# Patient Record
Sex: Male | Born: 1975 | Race: Asian | Hispanic: No | Marital: Single | Smoking: Never smoker
Health system: Southern US, Community
[De-identification: ages and names within clinical notes are randomized; demographics above are authoritative.]

## PROBLEM LIST (undated history)

## (undated) DIAGNOSIS — I2699 Other pulmonary embolism without acute cor pulmonale: Secondary | ICD-10-CM

## (undated) DIAGNOSIS — I82409 Acute embolism and thrombosis of unspecified deep veins of unspecified lower extremity: Secondary | ICD-10-CM

## (undated) HISTORY — DX: Acute embolism and thrombosis of unspecified deep veins of unspecified lower extremity: I82.409

---

## 2005-01-01 ENCOUNTER — Emergency Department (HOSPITAL_COMMUNITY): Admission: EM | Admit: 2005-01-01 | Discharge: 2005-01-01 | Payer: Self-pay | Admitting: Family Medicine

## 2005-01-16 DIAGNOSIS — I82409 Acute embolism and thrombosis of unspecified deep veins of unspecified lower extremity: Secondary | ICD-10-CM

## 2005-01-16 HISTORY — DX: Acute embolism and thrombosis of unspecified deep veins of unspecified lower extremity: I82.409

## 2005-06-22 ENCOUNTER — Emergency Department (HOSPITAL_COMMUNITY): Admission: EM | Admit: 2005-06-22 | Discharge: 2005-06-22 | Payer: Self-pay | Admitting: Emergency Medicine

## 2005-06-23 ENCOUNTER — Inpatient Hospital Stay (HOSPITAL_COMMUNITY): Admission: EM | Admit: 2005-06-23 | Discharge: 2005-06-24 | Payer: Self-pay | Admitting: Emergency Medicine

## 2005-06-23 ENCOUNTER — Ambulatory Visit: Payer: Self-pay | Admitting: Internal Medicine

## 2005-06-23 ENCOUNTER — Encounter: Payer: Self-pay | Admitting: Vascular Surgery

## 2005-06-26 ENCOUNTER — Ambulatory Visit: Payer: Self-pay | Admitting: Internal Medicine

## 2005-06-29 ENCOUNTER — Ambulatory Visit: Payer: Self-pay | Admitting: Internal Medicine

## 2005-07-03 ENCOUNTER — Ambulatory Visit: Payer: Self-pay | Admitting: Internal Medicine

## 2005-07-06 ENCOUNTER — Ambulatory Visit: Payer: Self-pay | Admitting: Internal Medicine

## 2005-07-10 ENCOUNTER — Ambulatory Visit: Payer: Self-pay | Admitting: Internal Medicine

## 2005-07-24 ENCOUNTER — Ambulatory Visit: Payer: Self-pay | Admitting: Internal Medicine

## 2005-09-04 ENCOUNTER — Ambulatory Visit: Payer: Self-pay | Admitting: Internal Medicine

## 2005-09-25 ENCOUNTER — Ambulatory Visit: Payer: Self-pay | Admitting: Internal Medicine

## 2005-09-28 ENCOUNTER — Ambulatory Visit: Payer: Self-pay | Admitting: Internal Medicine

## 2005-10-16 ENCOUNTER — Ambulatory Visit: Payer: Self-pay | Admitting: Hospitalist

## 2005-11-06 ENCOUNTER — Ambulatory Visit: Payer: Self-pay | Admitting: Internal Medicine

## 2005-12-04 ENCOUNTER — Ambulatory Visit: Payer: Self-pay | Admitting: Hospitalist

## 2005-12-25 ENCOUNTER — Ambulatory Visit: Payer: Self-pay | Admitting: Internal Medicine

## 2006-01-22 ENCOUNTER — Ambulatory Visit: Payer: Self-pay | Admitting: Hospitalist

## 2006-02-19 ENCOUNTER — Ambulatory Visit: Payer: Self-pay | Admitting: Internal Medicine

## 2006-03-19 ENCOUNTER — Ambulatory Visit: Payer: Self-pay | Admitting: *Deleted

## 2006-03-19 DIAGNOSIS — I82409 Acute embolism and thrombosis of unspecified deep veins of unspecified lower extremity: Secondary | ICD-10-CM | POA: Insufficient documentation

## 2006-03-19 LAB — CONVERTED CEMR LAB: INR: 1.3

## 2006-04-16 ENCOUNTER — Ambulatory Visit: Payer: Self-pay | Admitting: *Deleted

## 2006-04-16 ENCOUNTER — Encounter (INDEPENDENT_AMBULATORY_CARE_PROVIDER_SITE_OTHER): Payer: Self-pay | Admitting: Pharmacist

## 2006-04-16 LAB — CONVERTED CEMR LAB: INR: 1.8

## 2006-05-07 ENCOUNTER — Ambulatory Visit: Payer: Self-pay | Admitting: Internal Medicine

## 2006-05-07 LAB — CONVERTED CEMR LAB: INR: 2

## 2006-06-04 ENCOUNTER — Ambulatory Visit: Payer: Self-pay | Admitting: Internal Medicine

## 2006-06-04 LAB — CONVERTED CEMR LAB: INR: 2

## 2006-06-06 ENCOUNTER — Telehealth (INDEPENDENT_AMBULATORY_CARE_PROVIDER_SITE_OTHER): Payer: Self-pay | Admitting: *Deleted

## 2006-07-12 ENCOUNTER — Ambulatory Visit: Payer: Self-pay | Admitting: Internal Medicine

## 2010-02-15 NOTE — Assessment & Plan Note (Signed)
Summary: COUMADIN/VS   Anticoagulant Therapy Managed by: Barbera Setters. Alexandria Lodge III  PharmD CACP OPC Attending: Manning Charity MD Indication 1: Deep vein thrombus Indication 2: Aftercare long term use Anticoagulants V58.61 Start date: 06/23/2005 Duration: 1 year  Patient Assessment Reviewed by: Chancy Milroy PharmD  March 19, 2006 Medication review: verified warfarin dosage & schedule,verified previous prescription medications, verified doses & any changes, verified new medications, reviewed OTC medications, reviewed OTC health products-vitamins supplements etc Complications: none Dietary changes: none   Health status changes: none   Lifestyle changes: none   Recent/future hospitalizations: none   Recent/future procedures: none   Recent/future dental: none         Have you MISSED ANY DOSES or CHANGED TABLETS?  No missed Warfarin doses or changed tablets.  Have you had any BRUISING or BLEEDING ( nose or gum bleeds,blood in urine or stool)?   No reported bruising or bleeding in nose, gums, urine, stool.   Have you STARTED or STOPPED any MEDICATIONS, including OTC meds,herbals or supplements?  No other medications or herbal supplements were started or stopped.   Have you CHANGED your DIET, especially green vegetables,or ALCOHOL intake?   No changes in diet or alcohol intake.  Have you had any ILLNESSES or HOSPITALIZATIONS?    No reported illnesses or hospitalizations   Have you had any signs of CLOTTING?(chest discomfort,dizziness,shortness of breath,arms tingling,slurred speech,swelling or redness in leg)    No chest discomfort, dizziness, shortness of breath, tingling in arm, slurred speech, swelling, or redness in leg.   Target INR: 2.0-3.0 INR: 1.3  Date: 03/19/2006  INR reflects current regimen: 1.3   New  Tablet strength: : 5mg  Regimen Out:     Sunday: 1 & 1/2 Tablet     Monday: 1 & 1/2 Tablet     Tuesday: 1 & 1/2 Tablet     Wednesday: 1 & 1/2 Tablet     Thursday: 1 & 1/2  Tablet      Friday: 1 & 1/2 Tablet     Saturday: 1 & 1/2 Tablet Total Weekly: 52.5mg /week mg  Next INR Due: 04/16/2006 Adjusted by: Barbera Setters. Alexandria Lodge III PharmD CACP   Return to anticoagulation clinic:  04/16/2006   Comments: Today's regimen reflects 47.5mg /wk.  Will increase to 52.5 mg/wk.

## 2010-02-15 NOTE — Progress Notes (Signed)
Summary: refill/gg  Phone Note Refill Request  on Jun 06, 2006 4:42 PM  Refills Requested: Medication #1:  WARFARIN SODIUM  TABS Tablet Strength: 5mg  Take as directed. #50   Last visit with Dr Alexandria Lodge 5/19  Initial call taken by: Merrie Roof RN,  Jun 06, 2006 4:42 PM  Follow-up for Phone Call        Refill approved-nurse to complete Follow-up by: Eliseo Gum MD,  Jun 06, 2006 5:02 PM  Additional Follow-up for Phone Call Additional follow up Details #1::        Rx faxed to pharmacy Additional Follow-up by: Merrie Roof RN,  Jun 07, 2006 10:35 AM    Prescriptions: WARFARIN SODIUM  TABS (WARFARIN SODIUM TABS) Tablet Strength: 5mg  Take as directed  #52 x 0   Entered and Authorized by:   Eliseo Gum MD   Signed by:   Eliseo Gum MD on 06/06/2006   Method used:   Telephoned to ...         RxID:   [id number]

## 2010-02-15 NOTE — Assessment & Plan Note (Signed)
Summary: COUMADIN/VS  Anticoagulant Therapy Managed by: Barbera Setters. Alexandria Lodge III  PharmD CACP OPC Attending: Eliseo Gum MD Indication 1: Deep vein thrombus Indication 2: Aftercare long term use Anticoagulants V58.61 Start date: 06/23/2005 Duration: 1 year  Patient Assessment Reviewed by: Chancy Milroy PharmD  May 07, 2006 Medication review: verified warfarin dosage & schedule,verified previous prescription medications, verified doses & any changes, verified new medications, reviewed OTC medications, reviewed OTC health products-vitamins supplements etc Complications: none Dietary changes: none   Health status changes: none   Lifestyle changes: none   Recent/future hospitalizations: none   Recent/future procedures: none   Recent/future dental: none         Have you MISSED ANY DOSES or CHANGED TABLETS?  No missed Warfarin doses or changed tablets.  Have you had any BRUISING or BLEEDING ( nose or gum bleeds,blood in urine or stool)?   No reported bruising or bleeding in nose, gums, urine, stool.   Have you STARTED or STOPPED any MEDICATIONS, including OTC meds,herbals or supplements?  No other medications or herbal supplements were started or stopped.   Have you CHANGED your DIET, especially green vegetables,or ALCOHOL intake?   No changes in diet or alcohol intake.  Have you had any ILLNESSES or HOSPITALIZATIONS?    No reported illnesses or hospitalizations   Have you had any signs of CLOTTING?(chest discomfort,dizziness,shortness of breath,arms tingling,slurred speech,swelling or redness in leg)    No chest discomfort, dizziness, shortness of breath, tingling in arm, slurred speech, swelling, or redness in leg.   Target INR: 2.0-3.0 INR: 2.0  Date: 05/07/2006  INR reflects current regimen: 2.0   New  Tablet strength: : 5mg  Regimen Out:     Sunday: 1 & 1/2 Tablet     Monday: 2 Tablet     Tuesday: 1 & 1/2 Tablet     Wednesday: 1 & 1/2 Tablet     Thursday: 2 Tablet      Friday:  1 & 1/2 Tablet     Saturday: 1 & 1/2 Tablet Total Weekly: 57.5mg /week mg  Next INR Due: 06/04/2006 Adjusted by: Barbera Setters. Alexandria Lodge III PharmD CACP   Return to anticoagulation clinic:  06/04/2006

## 2010-02-15 NOTE — Miscellaneous (Signed)
  Clinical Lists Changes  Medications: Removed medication of WARFARIN SODIUM  TABS (WARFARIN SODIUM TABS) Tablet Strength: 5mg  Take as directed Removed medication of WARFARIN SODIUM  TABS (WARFARIN SODIUM TABS)

## 2010-02-15 NOTE — Assessment & Plan Note (Signed)
Summary: ROUTINE CK/VS   Vital Signs:  Patient Profile:   35 Years Old Male Weight:      180.1 pounds (81.86 kg) Temp:     98.7 degrees F (37.06 degrees C) oral Pulse rate:   60 / minute BP sitting:   102 / 53  (right arm)  Pt. in pain?   no  Vitals Entered By: Krystal Eaton Duncan Dull) (July 12, 2006 3:38 PM)              Is Patient Diabetic? No Nutritional Status Detail good  Have you ever been in a relationship where you felt threatened, hurt or afraid?denies   Does patient need assistance? Functional Status Self care Ambulation Normal   Chief Complaint:  FU on DVT - doing well.Marland Kitchen  History of Present Illness: Mr. Ballo comes today to our clinic to f/u on his DVT as he was told by Dr. Alexandria Lodge he can stop coumadin. He wants to know if it is Ok and also wants to know if he needs another doppler of his LE. He has post-phlebitic syndrome and wants to know what he can do for it as he gets some edema and occasional pain in his left LE. Otherwise he has been asymtomatic.    Current Meds:  WARFARIN SODIUM  TABS (WARFARIN SODIUM TABS) Tablet Strength: 5mg  Take as directed      Risk Factors:  Tobacco use:  quit   Review of Systems  The patient denies chest pain, syncope, dyspnea on exhertion, prolonged cough, hemoptysis, melena, and hematochezia.     Physical Exam  General:     alert and well-developed.   Lungs:     normal respiratory effort and normal breath sounds.   Extremities:     Mild varicose veins on left LE and trace edema.    Impression & Recommendations:  Problem # 1:  DVT (ICD-453.40) He is doing Ok. He has h/o question unprovoked DVT even though it was in the midst of prolonged travel. As already said he is to stop coumadin.  Plan:D/C coumadin. Check ABIs and then prescribe compresing stocking for left LE to help with post-phlebitic syndrome Future Orders: LE Arterial Doppler/ABI (LE arterial doppler) ... 07/31/2006    Patient  Instructions: 1)  Please schedule a follow-up appointment in 4 -6months.

## 2010-02-15 NOTE — Assessment & Plan Note (Signed)
Summary: COUMADIN  Anticoagulant Therapy Managed by: Barbera Setters. Alexandria Lodge III  PharmD CACP Indication 1: Deep vein thrombus Indication 2: Aftercare long term use Anticoagulants V58.61 Start date: 06/23/2005 Duration: 1 year  Patient Assessment Reviewed by: Chancy Milroy PharmD  April 16, 2006 Medication review: verified warfarin dosage & schedule,verified previous prescription medications, verified doses & any changes, verified new medications, reviewed OTC medications, reviewed OTC health products-vitamins supplements etc Complications: none Dietary changes: none   Health status changes: none   Lifestyle changes: none   Recent/future hospitalizations: none   Recent/future procedures: none   Recent/future dental: none         Have you MISSED ANY DOSES or CHANGED TABLETS?  States has missed a couple of days dosing with warfarin You indicated changed or missed doses: one or more missed dose(s)  Have you had any BRUISING or BLEEDING ( nose or gum bleeds,blood in urine or stool)?   No reported bruising or bleeding in nose, gums, urine, stool.   Have you STARTED or STOPPED any MEDICATIONS, including OTC meds,herbals or supplements?  No other medications or herbal supplements were started or stopped.   Have you CHANGED your DIET, especially green vegetables,or ALCOHOL intake?   No changes in diet or alcohol intake.  Have you had any ILLNESSES or HOSPITALIZATIONS?    No reported illnesses or hospitalizations   Have you had any signs of CLOTTING?(chest discomfort,dizziness,shortness of breath,arms tingling,slurred speech,swelling or redness in leg)    No chest discomfort, dizziness, shortness of breath, tingling in arm, slurred speech, swelling, or redness in leg.   Target INR: 2.0-3.0 INR: 1.8  Date: 04/16/2006  INR reflects current regimen: 1.8   New  Tablet strength: : 5mg  Regimen Out:     Sunday: 1 & 1/2 Tablet     Monday: 2 Tablet     Tuesday: 1 & 1/2 Tablet     Wednesday: 1 & 1/2  Tablet     Thursday: 2 Tablet      Friday: 1 & 1/2 Tablet     Saturday: 1 & 1/2 Tablet Total Weekly: 57.5mg /week mg

## 2010-06-03 NOTE — Discharge Summary (Signed)
NAMEKISHAWN, PICKAR NO.:  [id number]   MEDICAL RECORD NO.:  [id number]          PATIENT TYPE:  INP   LOCATION:  5004                         FACILITY:  [facility]   PHYSICIAN:  Duncan Dull, M.D.     DATE OF BIRTH:  December 08, 1975   DATE OF ADMISSION:  06/23/2005  DATE OF DISCHARGE:  06/24/2005                                 DISCHARGE SUMMARY   DISCHARGE DIAGNOSES:  1.  Deep venous thrombosis, proximal lower extremity.  2.  History of left leg thrombophlebitis (December, 2006 and in 2005).   DISCHARGE MEDICATIONS:  1.  Lovenox 75 mg injection subcu b.i.d until INR is therapeutic. (goal 2-3)  2.  Coumadin 10 mg tablet p.o. daily.  3.  Vicodin 1 tab p.o. q.6h. p.r.n.   CONDITION ON DISCHARGE:  IMPROVED.   DISPOSITION:  He is to return on June 11, at which time an INR will be checked at the  outpatient clinic.  He is to return to see Dr. Dennis Bast on June 21 for  hospital followup.   BRIEF ADMITTING HISTORY AND PHYSICAL:  Mr. Kucher is a 35 year old male  with a history of thrombophlebitis of his left lower extremity x2, who  presents with pain and swelling of his left calf.  He was seen in the  emergency department on June 22, 2005 for similar complaint and was sent to  have lower extremity Dopplers performed.  He was found to have a DVT in his  proximal profunda vein and the distal femoral vein that extends to the  popliteal vein.  He states that 3-4 days prior to admission, he developed  pain and swelling to the left calf after taking a one mile walk.  He states  that two times in the past, he has had inflammation of a vein in his calf  but has no known history of DVT.  He has no known family history of blood  clots.  He believes that his father may have had a stroke in his mid 42s.  He denies chest pain, shortness of breath, fever, chills, abdominal  complaints, urinary and bowel complaints.  He denies any similar sensation  in his other extremity.  Of note,  he notes that all episodes have been in  his left lower extremity, he notes in the left calf.   PHYSICAL EXAMINATION:  VITAL SIGNS:  Temperature 97.9, heart rate 55,  respirations 18, blood pressure 105/63.  Oxygen saturation 97% on room air.  GENERAL:  Alert and oriented x3 in no acute distress.  Sitting up in bed,  eating.  HEENT:  Bilateral scleral injection.  Extraocular movements are intact.  Pupils are equal, round and reactive to light.  Moist mucous membranes.  Oropharynx clear.  NECK:  Supple.  No lymphadenopathy.  CARDIOVASCULAR:  Regular rate and rhythm.  No murmurs.  PULMONARY:  Breath sounds clear to auscultation bilaterally.  ABDOMEN:  Soft, nontender, nondistended.  Active bowel sounds.  EXTREMITIES:  No clubbing, cyanosis or edema.  The left lower extremity with  area of erythema and induration on the medial aspect of the lower anterior  chin/calf.  Right lower extremity benign.  Full range of motion bilaterally.  NEURO:  Grossly intact.  PSYCH:  Appropriate.   ADMITTING LABS:  White blood cell count 4.8, hemoglobin 14.6, hematocrit  42.3, platelet count 158.  PT 14.3, INR 1.1, PTT 33.  Antithrombin III 99.  Protein C 68.  Functional protein C 114.  Protein S 82.  Functional protein  S 84.  Lupus anticoagulants not detected.  Sodium 137, potassium 3.8,  chloride 103, CO2 29, glucose 88, BUN 8, creatinine 0.8, calcium 9.2,  homocysteine 10.6.  UDS positive for THC.  Antinuclear antibody negative.  Beta 2 electroprotein antibodies negative.  Negative for prothrombin 2 gene  mutation.   HOSPITAL COURSE:  1.  Deep venous thrombosis:  Mr. Weinberg was placed on Lovenox initially      and started on Coumadin therapy.  He is to transition to Coumadin      exclusively as an outpatient.  He will be followed by Shaune Leeks in the      outpatient clinic.  2.  Pain control:  Mr. Stager was started on Vicodin 1 tab q.6h. p.r.n.      Any further pain management is to be  determined by Dr. Luiz Iron at his      hospital followup.   DISCHARGE LABS/VITALS:  Temperature 97.5, heart rate 62, respirations 20,  blood pressure 103/64.  Oxygen saturation 97% on room air.   White blood cell count 4.9, hemoglobin 14.6, hematocrit 43, platelet count  164.  PT 14.1, INR 1.1.      Sharin Mons, M.D.      Duncan Dull, M.D.  Electronically Signed    WC/MEDQ  D:  06/30/2005  T:  06/30/2005  Job:  [id number]

## 2012-08-30 ENCOUNTER — Other Ambulatory Visit: Payer: Self-pay | Admitting: Specialist

## 2012-08-30 ENCOUNTER — Ambulatory Visit
Admission: RE | Admit: 2012-08-30 | Discharge: 2012-08-30 | Disposition: A | Discharge status: Home / Self Care | Payer: Self-pay | Source: Ambulatory Visit | Attending: Specialist | Admitting: Specialist

## 2012-08-30 DIAGNOSIS — R7611 Nonspecific reaction to tuberculin skin test without active tuberculosis: Secondary | ICD-10-CM

## 2013-04-28 ENCOUNTER — Other Ambulatory Visit: Payer: Self-pay | Admitting: Family Medicine

## 2013-04-28 DIAGNOSIS — R0602 Shortness of breath: Secondary | ICD-10-CM

## 2013-04-28 DIAGNOSIS — R0682 Tachypnea, not elsewhere classified: Secondary | ICD-10-CM

## 2013-04-30 ENCOUNTER — Ambulatory Visit
Admission: RE | Admit: 2013-04-30 | Discharge: 2013-04-30 | Disposition: A | Discharge status: Home / Self Care | Payer: BC Managed Care – PPO | Source: Ambulatory Visit | Attending: Family Medicine | Admitting: Family Medicine

## 2013-04-30 DIAGNOSIS — R0602 Shortness of breath: Secondary | ICD-10-CM

## 2013-04-30 DIAGNOSIS — R0682 Tachypnea, not elsewhere classified: Secondary | ICD-10-CM

## 2013-04-30 MED ORDER — IOHEXOL 300 MG/ML  SOLN
75.0000 mL | Freq: Once | INTRAMUSCULAR | Status: AC | PRN
  Administered 2013-04-30: 75 mL via INTRAVENOUS

## 2013-08-25 ENCOUNTER — Other Ambulatory Visit: Payer: Self-pay | Admitting: Family Medicine

## 2013-08-25 DIAGNOSIS — R599 Enlarged lymph nodes, unspecified: Secondary | ICD-10-CM

## 2013-08-25 DIAGNOSIS — R0602 Shortness of breath: Secondary | ICD-10-CM

## 2013-08-26 ENCOUNTER — Ambulatory Visit (INDEPENDENT_AMBULATORY_CARE_PROVIDER_SITE_OTHER): Payer: BC Managed Care – PPO | Admitting: Internal Medicine

## 2013-08-26 ENCOUNTER — Other Ambulatory Visit (INDEPENDENT_AMBULATORY_CARE_PROVIDER_SITE_OTHER): Payer: BC Managed Care – PPO

## 2013-08-26 ENCOUNTER — Encounter: Payer: Self-pay | Admitting: Internal Medicine

## 2013-08-26 VITALS — BP 104/70 | HR 78 | Temp 98.7°F | Ht 72.0 in | Wt 178.0 lb

## 2013-08-26 DIAGNOSIS — R0989 Other specified symptoms and signs involving the circulatory and respiratory systems: Secondary | ICD-10-CM

## 2013-08-26 DIAGNOSIS — R0609 Other forms of dyspnea: Secondary | ICD-10-CM

## 2013-08-26 DIAGNOSIS — R0600 Dyspnea, unspecified: Secondary | ICD-10-CM

## 2013-08-26 LAB — BASIC METABOLIC PANEL
BUN: 9 mg/dL (ref 6–23)
CO2: 24 mEq/L (ref 19–32)
Calcium: 9.5 mg/dL (ref 8.4–10.5)
Chloride: 104 mEq/L (ref 96–112)
Creatinine, Ser: 1 mg/dL (ref 0.4–1.5)
GFR: 89.77 mL/min (ref >60.00)
Glucose, Bld: 81 mg/dL (ref 70–99)
Potassium: 4.5 mEq/L (ref 3.5–5.1)
Sodium: 138 mEq/L (ref 135–145)

## 2013-08-26 LAB — CBC WITH DIFFERENTIAL/PLATELET
Basophils Absolute: 0 10*3/uL (ref 0.0–0.1)
Basophils Relative: 0.3 % (ref 0.0–3.0)
Eosinophils Absolute: 0.3 10*3/uL (ref 0.0–0.7)
Eosinophils Relative: 4.1 % (ref 0.0–5.0)
HCT: 45.9 % (ref 39.0–52.0)
Hemoglobin: 15.2 g/dL (ref 13.0–17.0)
Lymphocytes Relative: 38.9 % (ref 12.0–46.0)
Lymphs Abs: 2.8 10*3/uL (ref 0.7–4.0)
MCHC: 33.1 g/dL (ref 30.0–36.0)
MCV: 82.9 fl (ref 78.0–100.0)
Monocytes Absolute: 0.6 10*3/uL (ref 0.1–1.0)
Monocytes Relative: 8.8 % (ref 3.0–12.0)
Neutro Abs: 3.4 10*3/uL (ref 1.4–7.7)
Neutrophils Relative %: 47.9 % (ref 43.0–77.0)
Platelets: 153 10*3/uL (ref 150.0–400.0)
RBC: 5.53 Mil/uL (ref 4.22–5.81)
RDW: 15 % (ref 11.5–15.5)
WBC: 7.1 10*3/uL (ref 4.0–10.5)

## 2013-08-26 LAB — TSH: TSH: 1.32 u[IU]/mL (ref 0.35–4.50)

## 2013-08-26 LAB — BRAIN NATRIURETIC PEPTIDE: Pro B Natriuretic peptide (BNP): 78 pg/mL (ref 0.0–100.0)

## 2013-08-26 MED ORDER — PANTOPRAZOLE SODIUM 40 MG PO TBEC: 40.0000 mg | DELAYED_RELEASE_TABLET | Freq: Every day | ORAL | Status: DC

## 2013-08-26 MED ORDER — FAMOTIDINE 20 MG PO TABS: ORAL_TABLET | ORAL | Status: DC

## 2013-08-26 NOTE — Progress Notes (Signed)
Subjective:    Patient ID: Isaiah Payne, male    DOB: [birth year],   MRN: [id number]  HPI  38 yomale neapalese some sinus problems c/w  seasonal rhinitis x decades with new onset doe Fall 2014 and w/u in [place] with ? PAH with nl D dimer there referred by Dr Docia Chuck to pulmonary clinic 08/26/13    08/26/2013 1st [facility] office visit/ Ermias Tomeo  Chief Complaint  Patient presents with  . Pulmonary Consult    Referred per Dr. Docia Chuck. Pt c/o SOB since Nov 2014. He states that he started noticing that he would get SOB while playing soccer. SOB has been progressively worse for the past several months.   sob p walking only after sits down. Only new complaint since onset of sob is sorethroat on L with variable HB not better on ppi x 2 weeks  No obvious other patterns in day to day or daytime variabilty or assoc chronic cough or cp or chest tightness, subjective wheeze overt sinus or hb symptoms. No unusual exp hx or h/o childhood pna/ asthma or knowledge of premature birth.  Sleeping ok without nocturnal  or early am exacerbation  of respiratory  c/o's or need for noct saba. Also denies any obvious fluctuation of symptoms with weather or environmental changes or other aggravating or alleviating factors except as outlined above   Current Medications, Allergies, Complete Past Medical History, Past Surgical History, Family History, and Social History were reviewed in [employer] record.            Review of Systems  Constitutional: Negative for fever, chills, activity change, appetite change and unexpected weight change.  HENT: Positive for congestion and sore throat. Negative for dental problem, postnasal drip, rhinorrhea, sneezing, trouble swallowing and voice change.   Eyes: Negative for visual disturbance.  Respiratory: Positive for shortness of breath. Negative for cough and choking.   Cardiovascular: Negative for chest pain and leg swelling.   Gastrointestinal: Negative for nausea, vomiting and abdominal pain.  Genitourinary: Negative for difficulty urinating.       Heart burn  Musculoskeletal: Negative for arthralgias.  Skin: Negative for rash.  Psychiatric/Behavioral: Negative for behavioral problems and confusion.       Objective:   Physical Exam   Wt Readings from Last 3 Encounters:  08/26/13 178 lb (80.74 kg)  07/12/06 180 lb 1.6 oz (81.693 kg)      HEENT: nl dentition, turbinates, and orophanx. Nl external ear canals without cough reflex   NECK :  without JVD/Nodes/TM/ nl carotid upstrokes bilaterally   LUNGS: no acc muscle use, clear to A and P bilaterally without cough on insp or exp maneuvers   CV:  RRR  no s3 or murmur - slt  increase in P2, no edema   ABD:  soft and nontender with nl excursion in the supine position. No bruits or organomegaly, bowel sounds nl  MS:  warm without deformities, calf tenderness, cyanosis or clubbing  SKIN: warm and dry without lesions    NEURO:  alert, approp, no deficits     Recent Labs Lab 08/26/13 1258  NA 138  K 4.5  CL 104  CO2 24  BUN 9  CREATININE 1.0  GLUCOSE 81    Recent Labs Lab 08/26/13 1258  HGB 15.2  HCT 45.9  WBC 7.1  PLT 153.0     Lab Results  Component Value Date   TSH 1.32 08/26/2013    Lab Results  Component Value Date  DDIMER 5.45* 08/26/2013   Lab Results  Component Value Date   PROBNP 78.0 08/26/2013         Assessment & Plan:

## 2013-08-26 NOTE — Patient Instructions (Addendum)
Pantoprazole (protonix) 40 mg   Take 30-60 min before first meal of the day and Pepcid 20 mg one bedtime until return to office  Please see patient coordinator before you leave today  to schedule repeat 2d echo   Please remember to go to the lab  department downstairs for your tests - we will call you with the results when they are available.  GERD (REFLUX)  is an extremely common cause of respiratory symptoms, many times with no significant heartburn at all.    It can be treated with medication, but also with lifestyle changes including avoidance of late meals, excessive alcohol, smoking cessation, and avoid fatty foods, chocolate, peppermint, colas, red wine, and acidic juices such as orange juice.  NO MINT OR MENTHOL PRODUCTS SO NO COUGH DROPS  USE SUGARLESS CANDY INSTEAD (jolley ranchers or Stover's)  NO OIL BASED VITAMINS - use powdered substitutes.      Please schedule a follow up office visit in 2 weeks, sooner if needed

## 2013-08-27 ENCOUNTER — Ambulatory Visit (INDEPENDENT_AMBULATORY_CARE_PROVIDER_SITE_OTHER)
Admission: RE | Admit: 2013-08-27 | Discharge: 2013-08-27 | Disposition: A | Discharge status: Home / Self Care | Payer: BC Managed Care – PPO | Source: Ambulatory Visit | Attending: Internal Medicine | Admitting: Internal Medicine

## 2013-08-27 ENCOUNTER — Encounter: Payer: Self-pay | Admitting: Internal Medicine

## 2013-08-27 ENCOUNTER — Ambulatory Visit (HOSPITAL_COMMUNITY): Payer: BC Managed Care – PPO

## 2013-08-27 ENCOUNTER — Telehealth: Payer: Self-pay | Admitting: Internal Medicine

## 2013-08-27 ENCOUNTER — Encounter (HOSPITAL_COMMUNITY): Payer: Self-pay | Admitting: Emergency Medicine

## 2013-08-27 ENCOUNTER — Inpatient Hospital Stay (HOSPITAL_COMMUNITY)
Admission: EM | Admit: 2013-08-27 | Discharge: 2013-09-01 | DRG: 176 | Disposition: A | Discharge status: Home / Self Care | Payer: BC Managed Care – PPO | Attending: Internal Medicine | Admitting: Internal Medicine

## 2013-08-27 DIAGNOSIS — Z86718 Personal history of other venous thrombosis and embolism: Secondary | ICD-10-CM | POA: Diagnosis not present

## 2013-08-27 DIAGNOSIS — D6859 Other primary thrombophilia: Secondary | ICD-10-CM | POA: Diagnosis present

## 2013-08-27 DIAGNOSIS — D6959 Other secondary thrombocytopenia: Secondary | ICD-10-CM | POA: Diagnosis present

## 2013-08-27 DIAGNOSIS — Z79899 Other long term (current) drug therapy: Secondary | ICD-10-CM | POA: Diagnosis not present

## 2013-08-27 DIAGNOSIS — I2699 Other pulmonary embolism without acute cor pulmonale: Secondary | ICD-10-CM

## 2013-08-27 DIAGNOSIS — Z87891 Personal history of nicotine dependence: Secondary | ICD-10-CM

## 2013-08-27 DIAGNOSIS — Z8672 Personal history of thrombophlebitis: Secondary | ICD-10-CM | POA: Diagnosis not present

## 2013-08-27 DIAGNOSIS — R9431 Abnormal electrocardiogram [ECG] [EKG]: Secondary | ICD-10-CM

## 2013-08-27 DIAGNOSIS — R0989 Other specified symptoms and signs involving the circulatory and respiratory systems: Secondary | ICD-10-CM

## 2013-08-27 DIAGNOSIS — I82409 Acute embolism and thrombosis of unspecified deep veins of unspecified lower extremity: Secondary | ICD-10-CM | POA: Diagnosis present

## 2013-08-27 DIAGNOSIS — I2789 Other specified pulmonary heart diseases: Secondary | ICD-10-CM | POA: Diagnosis present

## 2013-08-27 DIAGNOSIS — Z7982 Long term (current) use of aspirin: Secondary | ICD-10-CM

## 2013-08-27 DIAGNOSIS — R0609 Other forms of dyspnea: Secondary | ICD-10-CM | POA: Diagnosis present

## 2013-08-27 DIAGNOSIS — I2692 Saddle embolus of pulmonary artery without acute cor pulmonale: Principal | ICD-10-CM | POA: Diagnosis present

## 2013-08-27 DIAGNOSIS — K219 Gastro-esophageal reflux disease without esophagitis: Secondary | ICD-10-CM | POA: Diagnosis present

## 2013-08-27 DIAGNOSIS — R0600 Dyspnea, unspecified: Secondary | ICD-10-CM | POA: Diagnosis present

## 2013-08-27 DIAGNOSIS — I82403 Acute embolism and thrombosis of unspecified deep veins of lower extremity, bilateral: Secondary | ICD-10-CM

## 2013-08-27 LAB — BASIC METABOLIC PANEL
Anion gap: 14 (ref 5–15)
BUN: 9 mg/dL (ref 6–23)
CO2: 24 mEq/L (ref 19–32)
Calcium: 9.2 mg/dL (ref 8.4–10.5)
Chloride: 102 mEq/L (ref 96–112)
Creatinine, Ser: 0.9 mg/dL (ref 0.50–1.35)
GFR calc Af Amer: >90 mL/min (ref >90)
GFR calc non Af Amer: >90 mL/min (ref >90)
Glucose, Bld: 82 mg/dL (ref 70–99)
Potassium: 4.3 mEq/L (ref 3.7–5.3)
Sodium: 140 mEq/L (ref 137–147)

## 2013-08-27 LAB — CBC WITH DIFFERENTIAL/PLATELET
Basophils Absolute: 0.1 10*3/uL (ref 0.0–0.1)
Basophils Relative: 1 % (ref 0–1)
Eosinophils Absolute: 0.3 10*3/uL (ref 0.0–0.7)
Eosinophils Relative: 4 % (ref 0–5)
HCT: 41.8 % (ref 39.0–52.0)
Hemoglobin: 14.4 g/dL (ref 13.0–17.0)
Lymphocytes Relative: 36 % (ref 12–46)
Lymphs Abs: 2.5 10*3/uL (ref 0.7–4.0)
MCH: 28 pg (ref 26.0–34.0)
MCHC: 34.4 g/dL (ref 30.0–36.0)
MCV: 81.3 fL (ref 78.0–100.0)
Monocytes Absolute: 0.6 10*3/uL (ref 0.1–1.0)
Monocytes Relative: 8 % (ref 3–12)
Neutro Abs: 3.4 10*3/uL (ref 1.7–7.7)
Neutrophils Relative %: 51 % (ref 43–77)
Platelets: 139 10*3/uL — ABNORMAL LOW (ref 150–400)
RBC: 5.14 MIL/uL (ref 4.22–5.81)
RDW: 14.2 % (ref 11.5–15.5)
WBC: 6.9 10*3/uL (ref 4.0–10.5)

## 2013-08-27 LAB — HOMOCYSTEINE: Homocysteine: 9.8 umol/L (ref 4.0–15.4)

## 2013-08-27 LAB — PRO B NATRIURETIC PEPTIDE: Pro B Natriuretic peptide (BNP): 485.8 pg/mL — ABNORMAL HIGH (ref 0–125)

## 2013-08-27 LAB — ANTITHROMBIN III: AntiThromb III Func: 107 % (ref 75–120)

## 2013-08-27 LAB — TROPONIN I: Troponin I: <0.3 ng/mL (ref <0.30)

## 2013-08-27 LAB — MRSA PCR SCREENING: MRSA by PCR: NEGATIVE

## 2013-08-27 LAB — D-DIMER, QUANTITATIVE: D-Dimer, Quant: 5.45 ug/mL-FEU — ABNORMAL HIGH (ref 0.00–0.48)

## 2013-08-27 MED ORDER — SODIUM CHLORIDE 0.9 % IV SOLN
INTRAVENOUS | Status: DC
Start: 2013-08-27 — End: 2013-08-27
  Administered 2013-08-27: 18:00:00 via INTRAVENOUS

## 2013-08-27 MED ORDER — SODIUM CHLORIDE 0.9 % IV SOLN
INTRAVENOUS | Status: AC
  Administered 2013-08-27: 20:00:00 via INTRAVENOUS

## 2013-08-27 MED ORDER — IOHEXOL 350 MG/ML SOLN
80.0000 mL | Freq: Once | INTRAVENOUS | Status: AC | PRN
  Administered 2013-08-27: 80 mL via INTRAVENOUS

## 2013-08-27 MED ORDER — ACETAMINOPHEN 325 MG PO TABS: 650.0000 mg | ORAL_TABLET | Freq: Four times a day (QID) | ORAL | Status: DC | PRN

## 2013-08-27 MED ORDER — PANTOPRAZOLE SODIUM 40 MG PO TBEC
40.0000 mg | DELAYED_RELEASE_TABLET | Freq: Every day | ORAL | Status: DC
  Administered 2013-08-30 – 2013-09-01 (×3): 40 mg via ORAL
  Filled 2013-08-27 (×4): qty 1

## 2013-08-27 MED ORDER — ONDANSETRON HCL 4 MG/2ML IJ SOLN: 4.0000 mg | Freq: Three times a day (TID) | INTRAMUSCULAR | Status: DC | PRN

## 2013-08-27 MED ORDER — ACETAMINOPHEN 650 MG RE SUPP
650.0000 mg | Freq: Four times a day (QID) | RECTAL | Status: DC | PRN
Start: 2013-08-27 — End: 2013-08-28

## 2013-08-27 MED ORDER — HEPARIN BOLUS VIA INFUSION
5000.0000 [IU] | Freq: Once | INTRAVENOUS | Status: AC
  Administered 2013-08-27: 5000 [IU] via INTRAVENOUS
  Filled 2013-08-27: qty 5000

## 2013-08-27 MED ORDER — ONDANSETRON HCL 4 MG/2ML IJ SOLN: 4.0000 mg | Freq: Four times a day (QID) | INTRAMUSCULAR | Status: DC | PRN

## 2013-08-27 MED ORDER — MORPHINE SULFATE 2 MG/ML IJ SOLN: 1.0000 mg | INTRAMUSCULAR | Status: DC | PRN

## 2013-08-27 MED ORDER — ONDANSETRON HCL 4 MG PO TABS: 4.0000 mg | ORAL_TABLET | Freq: Four times a day (QID) | ORAL | Status: DC | PRN

## 2013-08-27 MED ORDER — HEPARIN (PORCINE) IN NACL 100-0.45 UNIT/ML-% IJ SOLN
1200.0000 [IU]/h | INTRAMUSCULAR | Status: DC
  Administered 2013-08-27 – 2013-08-28 (×2): 1400 [IU]/h via INTRAVENOUS
  Filled 2013-08-27 (×3): qty 250

## 2013-08-27 MED ORDER — SODIUM CHLORIDE 0.9 % IJ SOLN
3.0000 mL | Freq: Two times a day (BID) | INTRAMUSCULAR | Status: DC
  Administered 2013-08-27 – 2013-08-30 (×2): 3 mL via INTRAVENOUS

## 2013-08-27 MED ORDER — FAMOTIDINE 20 MG PO TABS
20.0000 mg | ORAL_TABLET | Freq: Every day | ORAL | Status: DC
  Administered 2013-08-29 – 2013-08-31 (×3): 20 mg via ORAL
  Filled 2013-08-27 (×7): qty 1

## 2013-08-27 MED ORDER — ALBUTEROL SULFATE (2.5 MG/3ML) 0.083% IN NEBU: 2.5000 mg | INHALATION_SOLUTION | RESPIRATORY_TRACT | Status: DC | PRN

## 2013-08-27 NOTE — ED Notes (Signed)
Inpatient MD at bedside

## 2013-08-27 NOTE — Telephone Encounter (Signed)
Pt scheduled @1 :45 pm today @[facility] for CTA & venous duplex; pt aware of appts & test instructions [name]

## 2013-08-27 NOTE — Telephone Encounter (Signed)
Pt calling back wanting to know one lab specifically-- Platelets 150.0 - 400.0 K/uL == 153.0 Pt advised. Nothing further needed.

## 2013-08-27 NOTE — Telephone Encounter (Signed)
Notes Recorded by Christen ButterLeslie M Raskin, CMA on 08/27/2013 at 9:15 AM Spoke with pt and notified of results per Dr. Sherene SiresWert. Pt verbalized understanding and denied any questions.  Notes Recorded by Nyoka CowdenMichael B Wert, MD on 08/27/2013 at 7:57 AM Call patient : Study is c/w blood clots, needs CTa asap today and bilateral venous dopplers (I realize he had them in [place] but his D dimer was nl then) Notes Recorded by Nyoka CowdenMichael B Wert, MD on 08/26/2013 at 8:02 PM Call patient : Studies are unremarkable, no change in recs ---  Pt was calling bc he has not been called yet to have the tests done. Orders placed and per MW needs to be done ASAP today. Please advise PCC's thanks

## 2013-08-27 NOTE — ED Notes (Addendum)
[facility], per EMS pt c/o of shortness of breath with excertion x 7-8 months. Pt denies chest pain, n/v, cough. Pt states he produces minimal clear sputum "all the time." Pt states for the past 7-8 months he has been short of breath when lifting heavy objects but not when ambulating. Pt states he began to get worse and felt short of breath when ambulating yesterday. Pt saw his PCP yesterday and was scheduled for CT angio chest for today.  [facility] for [facility]. Pt respirations even and unlabored, chest expansion symmetrically. NAD noted.

## 2013-08-27 NOTE — Assessment & Plan Note (Signed)
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

## 2013-08-27 NOTE — ED Provider Notes (Signed)
CSN: [id number]     Arrival date & time 08/27/13  1530 History   First MD Initiated Contact with Patient 08/27/13 1533     Chief Complaint  Patient presents with  . Shortness of Breath     (Consider location/radiation/quality/duration/timing/severity/associated sxs/prior Treatment) HPI Comments: 38 year old male with history of DVT 2007 completed Coumadin, primary Dr. Deboraha Sprang presents after being sent in for abnormal CT angina the chest. Patient said worsening dyspnea exertional for the past 70 months. Patient denies any new or recent leg swelling or leg pain, recent surgery, active cancer. 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 No family history of known clotting problems. No current chest pain. Symptoms improved with rest. Patient was sent to pulmonology and had CT angina the chest which showed saddle pulmonary wasn't.  Patient is a 38 y.o. male presenting with shortness of breath. The history is provided by the patient.  Shortness of Breath Associated symptoms: no abdominal pain, no chest pain, no fever, no headaches, no neck pain, no rash and no vomiting     Past Medical History  Diagnosis Date  . DVT (deep venous thrombosis) 2007    Left leg- txed with warfarin x 1 yr   History reviewed. No pertinent past surgical history. No family history on file. History  Substance Use Topics  . Smoking status: Never Smoker   . Smokeless tobacco: Never Used  . Alcohol Use: Yes     Comment: 1-2 beers daily    Review of Systems  Constitutional: Negative for fever and chills.  HENT: Negative for congestion.   Eyes: Negative for visual disturbance.  Respiratory: Positive for shortness of breath.   Cardiovascular: Negative for chest pain.  Gastrointestinal: Negative for vomiting and abdominal pain.  Genitourinary: Negative for dysuria and flank pain.  Musculoskeletal: Negative for back pain, neck pain and neck stiffness.  Skin: Negative for rash.  Neurological: Negative for  light-headedness and headaches.      Allergies  Review of patient's allergies indicates no known allergies.  Home Medications   Prior to Admission medications   Medication Sig Start Date End Date Taking? Authorizing Provider  aspirin 81 MG tablet Take 81 mg by mouth daily as needed for pain.    Yes Historical Provider, MD  famotidine (PEPCID) 20 MG tablet Take 20 mg by mouth at bedtime.   Yes Historical Provider, MD  pantoprazole (PROTONIX) 40 MG tablet Take 1 tablet (40 mg total) by mouth daily. Take 30-60 min before first meal of the day 08/26/13  Yes Nyoka Cowden, MD   BP 102/74  Pulse 71  Temp(Src) 97.9 F (36.6 C) (Oral)  Resp 11  Ht 6' (1.829 m)  Wt 180 lb (81.647 kg)  BMI 24.41 kg/m2  SpO2 97% Physical Exam  Nursing note and vitals reviewed. Constitutional: He is oriented to person, place, and time. He appears well-developed and well-nourished.  HENT:  Head: Normocephalic and atraumatic.  Eyes: Conjunctivae are normal. Right eye exhibits no discharge. Left eye exhibits no discharge.  Neck: Normal range of motion. Neck supple. No tracheal deviation present.  Cardiovascular: Normal rate and regular rhythm.   Pulmonary/Chest: Breath sounds normal. Respiratory distress: tachypnea.  Abdominal: Soft. He exhibits no distension. There is no tenderness. There is no guarding.  Musculoskeletal: He exhibits no edema and no tenderness.  Neurological: He is alert and oriented to person, place, and time.  Skin: Skin is warm. No rash noted.  Psychiatric: He has a normal mood and affect.  ED Course  Procedures (including critical care time) CRITICAL CARE Performed by: Enid Skeens   Total critical care time: 30 min  Critical care time was exclusive of separately billable procedures and treating other patients.  Critical care was necessary to treat or prevent imminent or life-threatening deterioration.  Critical care was time spent personally by me on the following  activities: development of treatment plan with patient and/or surrogate as well as nursing, discussions with consultants, evaluation of patient's response to treatment, examination of patient, obtaining history from patient or surrogate, ordering and performing treatments and interventions, ordering and review of laboratory studies, ordering and review of radiographic studies, pulse oximetry and re-evaluation of patient's condition.    EMERGENCY DEPARTMENT [place] CARDIAC EXAM "Study: Limited Ultrasound of the heart and pericardium"  INDICATIONS:Dyspnea Multiple views of the heart and pericardium were obtained in real-time with a multi-frequency probe.  PERFORMED ZO:XWRUEA  IMAGES ARCHIVED?: Yes  FINDINGS: No pericardial effusion right Ventricle similar size to left, good EF  LIMITATIONS: none  VIEWS USED: Subcostal 4 chamber, Parasternal long axis, Parasternal short axis and Apical 4 chamber   INTERPRETATION: Cardiac activity present, Pericardial effusioin absent, Cardiac tamponade absent and Normal contractility  Right heart strain  Labs Review Labs Reviewed  PRO B NATRIURETIC PEPTIDE - Abnormal; Notable for the following:    Pro B Natriuretic peptide (BNP) 485.8 (*)    All other components within normal limits  CBC WITH DIFFERENTIAL - Abnormal; Notable for the following:    Platelets 139 (*)    All other components within normal limits  HEPARIN LEVEL (UNFRACTIONATED) - Abnormal; Notable for the following:    Heparin Unfractionated 0.79 (*)    All other components within normal limits  MRSA PCR SCREENING  BASIC METABOLIC PANEL  TROPONIN I  ANTITHROMBIN III  HOMOCYSTEINE  CBC  PROTEIN C ACTIVITY  PROTEIN C, TOTAL  PROTEIN S ACTIVITY  PROTEIN S, TOTAL  LUPUS ANTICOAGULANT PANEL  BETA-2-GLYCOPROTEIN I ABS, IGG/M/A  FACTOR 5 LEIDEN  PROTHROMBIN GENE MUTATION  CARDIOLIPIN ANTIBODIES, IGG, IGM, IGA  COMPREHENSIVE METABOLIC PANEL  TROPONIN I   and  Imaging Review Ct Angio  Chest W/cm &/or Wo Cm  08/27/2013   CLINICAL DATA:  Shortness of breath with exertion for 7-8 months, history DVT, question pulmonary embolism  EXAM: CT ANGIOGRAPHY CHEST WITH CONTRAST  TECHNIQUE: Multidetector CT imaging of the chest was performed using the standard protocol during bolus administration of intravenous contrast. Multiplanar CT image reconstructions and MIPs were obtained to evaluate the vascular anatomy.  CONTRAST:  80mL OMNIPAQUE IOHEXOL 350 MG/ML SOLN  COMPARISON:  CT chest 04/30/2013  FINDINGS: Aorta normal caliber without aneurysm or dissection.  Large BILATERAL pulmonary emboli identified.  Saddle embolus at bifurcation of RIGHT main pulmonary artery extending into RIGHT upper lobe, RIGHT lower lobe and RIGHT middle lobe.  Large saddle embolus at bifurcation of LEFT pulmonary artery extending into LEFT upper and LEFT lower lobes.  Associated increased RV/LV ratio of 1.36 indicative of RIGHT heart strain and at least submassive pulmonary embolism.  Normal-size lymph nodes at the hila bilaterally.  Visualized portion of upper abdomen normal appearance.  Minimal pericardial fluid inferiorly.  Lungs clear.  No pleural effusion or pneumothorax.  Osseous structures unremarkable.  Review of the MIP images confirms the above findings.  IMPRESSION: BILATERAL pulmonary emboli including saddle emboli at the bifurcations of the LEFT and RIGHT pulmonary arteries extending into all lobes.  CT evidence of right heartstrain (RV/LV Ratio = 1.36) consistent with  at least submassive (intermediate risk) PE.  The presence of right heart strain has been associated with an increased risk of morbidity and mortality.  Critical Value/emergent results were called by telephone at the time of interpretation on 08/27/2013 at 2:27 pm to Dr. Sandrea HughsMICHAEL WERT , who verbally acknowledged these results.   Electronically Signed   By: Ulyses SouthwardMark  Boles M.D.   On: 08/27/2013 14:32     EKG Interpretation   Date/Time:  Wednesday August 27 2013 16:15:21 EDT Ventricular Rate:  77 PR Interval:  174 QRS Duration: 91 QT Interval:  429 QTC Calculation: 485 R Axis:   80 Text Interpretation:  Sinus rhythm Abnormal T, probable ischemia,  widespread Borderline ST elevation, lateral leads Confirmed by Avni Traore  MD,  Arie Powell ([id number]) on 08/27/2013 4:30:27 PM      MDM   Final diagnoses:  Acute massive pulmonary embolism  Dyspnea  Abnormal EKG  History of DVT (deep vein thrombosis)   CT results reviewed showing acute saddle pulmonary embolism, bilateral pulmonary emboli. Bedside ultrasound done showing mild right heart strain as right ventricle similar size the left. Vitals stable in ER, plan for pulmonary consult and blood thinners. EKG reviewed showing T-wave inversions anterior and strain pattern.  Multiple rechecks, no acute changes.    The patients results and plan were reviewed and discussed.   Any x-rays performed were personally reviewed by myself.   Differential diagnosis were considered with the presenting HPI.  Medications - No data to display   Filed Vitals:   08/27/13 1531 08/27/13 1532 08/27/13 1615  BP: 101/56  102/74  Pulse: 68  71  Temp: 97.9 F (36.6 C)    TempSrc: Oral    Resp: 21  11  Height:  6' (1.829 m)   Weight:  180 lb (81.647 kg)   SpO2: 98%  97%    Admission/ observation were discussed with the admitting physician, patient and/or family and they are comfortable with the plan.     Enid SkeensJoshua M Annastasia Haskins, MD 08/28/13 0130

## 2013-08-27 NOTE — H&P (Addendum)
[facility] History and Physical  Antonius Hartlage TUU:[id number] DOB: [birth year] DOA: 08/27/2013   PCP: Lujean Amel, MD  Specialists: Recently seen by Dr. Melvyn Novas  Chief Complaint: Shortness of breath ongoing for about 2 weeks or so  HPI: Isaiah Payne is a 37 y.o. 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 He tells me that he was in [place] recently and return from there about a week ago. This was a long flight. He also has been having cough with clear expectoration. Denies any blood in the sputum. Denies any chest pain whatsoever. No nausea, vomiting. For the last day or so he has noticed some dizziness and lightheadedness, but denies any syncopal episode. Denies any leg swelling. Interestingly when he was in [place] he went to see a doctor there and complained of shortness of breath. Dopplers were done, which were negative. No CT scan was done. And apparently, blood work was done, which suggested protein S deficiency.  Home Medications: Prior to Admission medications   Medication Sig Start Date End Date Taking? Authorizing Provider  aspirin 81 MG tablet Take 81 mg by mouth daily as needed for pain.    Yes Historical Provider, MD  famotidine (PEPCID) 20 MG tablet Take 20 mg by mouth at bedtime.   Yes Historical Provider, MD  pantoprazole (PROTONIX) 40 MG tablet Take 1 tablet (40 mg total) by mouth daily. Take 30-60 min before first meal of the day 08/26/13  Yes Tanda Rockers, MD    Allergies: No Known Allergies  Past Medical History: Past Medical History  Diagnosis Date  . DVT (deep venous thrombosis) 2007    Left leg- txed with warfarin x 1 yr    History reviewed. No pertinent past surgical history.  Social History: Patient lives in [place] with his wife and children. 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 Denies smoking. He used to drink regularly up until a month ago, but he has  quit since. Denies any recreational drug use. Independent with daily activities.  Family History:  Family History  Problem Relation Age of Onset  . Transient ischemic attack Father      Review of Systems - History obtained from the patient General ROS: positive for  - fatigue Psychological ROS: negative Ophthalmic ROS: negative ENT ROS: negative Allergy and Immunology ROS: negative Hematological and Lymphatic ROS: negative Endocrine ROS: negative Respiratory ROS: as in hpi Cardiovascular ROS: as in hpi Gastrointestinal ROS: no abdominal pain, change in bowel habits, or black or bloody stools Genito-Urinary ROS: no dysuria, trouble voiding, or hematuria Musculoskeletal ROS: negative Neurological ROS: no TIA or stroke symptoms Dermatological ROS: negative  Physical Examination  Filed Vitals:   08/27/13 1532 08/27/13 1545 08/27/13 1615 08/27/13 1645  BP:  107/80 102/74 108/78  Pulse:  79 71 70  Temp:      TempSrc:      Resp:  $Remo'29 11 23  'bHnia$ Height: 6' (1.829 m)     Weight: 81.647 kg (180 lb)     SpO2:  98% 97% 99%    BP 108/78  Pulse 70  Temp(Src) 97.9 F (36.6 C) (Oral)  Resp 23  Ht 6' (1.829 m)  Wt 81.647 kg (180 lb)  BMI 24.41 kg/m2  SpO2 99%  General appearance: alert, cooperative, appears stated age and no distress Head: Normocephalic, without obvious abnormality, atraumatic Eyes: conjunctivae/corneas clear. PERRL, EOM's intact. Throat: lips, mucosa, and tongue  normal; teeth and gums normal Neck: no adenopathy, no carotid bruit, no JVD, supple, symmetrical, trachea midline and thyroid not enlarged, symmetric, no tenderness/mass/nodules Resp: clear to auscultation bilaterally Cardio: regular rate and rhythm, S1, S2 normal, no murmur, click, rub or gallop GI: soft, non-tender; bowel sounds normal; no masses,  no organomegaly Extremities: extremities normal, atraumatic, no cyanosis or edema Pulses: 2+ and symmetric Skin: Skin color, texture, turgor normal. No  rashes or lesions Lymph nodes: Cervical, supraclavicular, and axillary nodes normal. Neurologic: Alert and oriented x 3. No focal deficits.  Laboratory Data: Results for orders placed during the hospital encounter of 08/27/13 (from the past 48 hour(s))  PRO B NATRIURETIC PEPTIDE     Status: Abnormal   Collection Time    08/27/13  4:08 PM      Result Value Ref Range   Pro B Natriuretic peptide (BNP) 485.8 (*) 0 - 125 pg/mL  CBC WITH DIFFERENTIAL     Status: Abnormal   Collection Time    08/27/13  4:08 PM      Result Value Ref Range   WBC 6.9  4.0 - 10.5 K/uL   RBC 5.14  4.22 - 5.81 MIL/uL   Hemoglobin 14.4  13.0 - 17.0 g/dL   HCT 41.8  39.0 - 52.0 %   MCV 81.3  78.0 - 100.0 fL   MCH 28.0  26.0 - 34.0 pg   MCHC 34.4  30.0 - 36.0 g/dL   RDW 14.2  11.5 - 15.5 %   Platelets 139 (*) 150 - 400 K/uL   Neutrophils Relative % 51  43 - 77 %   Lymphocytes Relative 36  12 - 46 %   Monocytes Relative 8  3 - 12 %   Eosinophils Relative 4  0 - 5 %   Basophils Relative 1  0 - 1 %   Neutro Abs 3.4  1.7 - 7.7 K/uL   Lymphs Abs 2.5  0.7 - 4.0 K/uL   Monocytes Absolute 0.6  0.1 - 1.0 K/uL   Eosinophils Absolute 0.3  0.0 - 0.7 K/uL   Basophils Absolute 0.1  0.0 - 0.1 K/uL   WBC Morphology ATYPICAL LYMPHOCYTES    BASIC METABOLIC PANEL     Status: None   Collection Time    08/27/13  4:08 PM      Result Value Ref Range   Sodium 140  137 - 147 mEq/L   Potassium 4.3  3.7 - 5.3 mEq/L   Chloride 102  96 - 112 mEq/L   CO2 24  19 - 32 mEq/L   Glucose, Bld 82  70 - 99 mg/dL   BUN 9  6 - 23 mg/dL   Creatinine, Ser 0.90  0.50 - 1.35 mg/dL   Calcium 9.2  8.4 - 10.5 mg/dL   GFR calc non Af Amer >90  >90 mL/min   GFR calc Af Amer >90  >90 mL/min   Comment: (NOTE)     The eGFR has been calculated using the CKD EPI equation.     This calculation has not been validated in all clinical situations.     eGFR's persistently <90 mL/min signify possible Chronic Kidney     Disease.   Anion gap 14  5 - 15    TROPONIN I     Status: None   Collection Time    08/27/13  4:08 PM      Result Value Ref Range   Troponin I <0.30  <0.30 ng/mL  Comment:            Due to the release kinetics of cTnI,     a negative result within the first hours     of the onset of symptoms does not rule out     myocardial infarction with certainty.     If myocardial infarction is still suspected,     repeat the test at appropriate intervals.    Radiology Reports: Ct Angio Chest W/cm &/or Wo Cm  08/27/2013   CLINICAL DATA:  Shortness of breath with exertion for 7-8 months, history DVT, question pulmonary embolism  EXAM: CT ANGIOGRAPHY CHEST WITH CONTRAST  TECHNIQUE: Multidetector CT imaging of the chest was performed using the standard protocol during bolus administration of intravenous contrast. Multiplanar CT image reconstructions and MIPs were obtained to evaluate the vascular anatomy.  CONTRAST:  75mL OMNIPAQUE IOHEXOL 350 MG/ML SOLN  COMPARISON:  CT chest 04/30/2013  FINDINGS: Aorta normal caliber without aneurysm or dissection.  Large BILATERAL pulmonary emboli identified.  Saddle embolus at bifurcation of RIGHT main pulmonary artery extending into RIGHT upper lobe, RIGHT lower lobe and RIGHT middle lobe.  Large saddle embolus at bifurcation of LEFT pulmonary artery extending into LEFT upper and LEFT lower lobes.  Associated increased RV/LV ratio of 1.36 indicative of RIGHT heart strain and at least submassive pulmonary embolism.  Normal-size lymph nodes at the hila bilaterally.  Visualized portion of upper abdomen normal appearance.  Minimal pericardial fluid inferiorly.  Lungs clear.  No pleural effusion or pneumothorax.  Osseous structures unremarkable.  Review of the MIP images confirms the above findings.  IMPRESSION: BILATERAL pulmonary emboli including saddle emboli at the bifurcations of the LEFT and RIGHT pulmonary arteries extending into all lobes.  CT evidence of right heartstrain (RV/LV Ratio = 1.36) consistent  with at least submassive (intermediate risk) PE.  The presence of right heart strain has been associated with an increased risk of morbidity and mortality.  Critical Value/emergent results were called by telephone at the time of interpretation on 08/27/2013 at 2:27 pm to Dr. Christinia Gully , who verbally acknowledged these results.   Electronically Signed   By: Lavonia Dana M.D.   On: 08/27/2013 14:32    Electrocardiogram: Sinus rhythm at 77 beats per minute. Normal axis. Intervals are normal. Diffuse T-wave inversions noted in inferior leads, as well as V1 through V4. Some ST segment changes also noted in the lateral leads. No older EKGs available for comparison.  Problem List  Principal Problem:   Acute massive pulmonary embolism Active Problems:   Dyspnea   History of DVT (deep vein thrombosis)   Assessment: This is a 38 year old male of [place] origin who presents with shortness of breath, and is found to have a massive pulmonary embolism, with right heart strain. He has diffuse T-wave changes on his EKG. Hemodynamically however, he is completely stable.  Plan: #1 massive acute bilateral pulmonary embolism, with Saddle emboli. This is his second thromboembolic event. He is currently hemodynamically stable. Despite that he'll be monitored in step down unit for tonight. He'll be placed on intravenous heparin. Pulmonology will be consulted. We will send off a hypercoagulable panel. He will require lifelong anticoagulation at this point. Whether to use warfarin or one of the novel oral anticoagulants can be decided tomorrow. Venous Dopplers will be ordered as well.  #2 abnormal EKG with diffuse T-wave changes: Most likely secondary to his PE. No older EKG is available for comparison. He denies any chest pain. Troponin is  normal. EKG will be repeated in the morning. Echocardiogram will be ordered.  #3 history of GERD: Continue with his PPI.  #4 mild thrombocytopenia: Possibly due to consumption.  Monitor counts daily.  DVT Prophylaxis: He will be on full anticoagulation Code Status: Full code Family Communication: Discussed in detail with the patient. No family at bedside  Disposition Plan: Admit to step down   Further management decisions will depend on results of further testing and patient's response to treatment.   [facility]  [facility] Pager [phone]  If 7PM-7AM, please contact night-coverage [email or web address] Password [facility]  08/27/2013, 5:35 PM  Disclaimer: This note was dictated with voice recognition software. Similar sounding words can inadvertently be transcribed and may not be corrected upon review.

## 2013-08-27 NOTE — Progress Notes (Signed)
Quick Note:  Spoke with pt and notified of results per Dr. Wert. Pt verbalized understanding and denied any questions.  ______ 

## 2013-08-27 NOTE — Progress Notes (Signed)
ANTICOAGULATION CONSULT NOTE - Initial Consult  Pharmacy Consult for heparin Indication: pulmonary embolus  No Known Allergies  Patient Measurements: Height: 6' (182.9 cm) Weight: 180 lb (81.647 kg) IBW/kg (Calculated) : 77.6 Heparin Dosing Weight: 81.6kg  Vital Signs: Temp: 97.9 F (36.6 C) (08/12 1531) Temp src: Oral (08/12 1531) BP: 108/78 mmHg (08/12 1645) Pulse Rate: 70 (08/12 1645)  Labs:  Recent Labs  08/26/13 1258 08/27/13 1608  HGB 15.2 14.4  HCT 45.9 41.8  PLT 153.0 139*  CREATININE 1.0  --     Estimated Creatinine Clearance: 109.9 ml/min (by C-G formula based on Cr of 1).   Medical History: Past Medical History  Diagnosis Date  . DVT (deep venous thrombosis) 2007    Left leg- txed with warfarin x 1 yr    Medications:  Infusions:  . heparin    . heparin      Assessment: 38 yom presented to the ED with SOB. He saw his PCP yesterday and was sent to the ED with bilateral saddle PE with right heart strain. Pt is currently hemodynamically stable. Baseline H/H is WNL but platelets are slightly low. He was on coumadin in the past but has completed course of therapy. He is currently not on any anticoagulation at home.   Goal of Therapy:  Heparin level 0.3-0.7 units/ml Monitor platelets by anticoagulation protocol: Yes   Plan:  1. Heparin bolus 5000 units IV x 1 2. Heparin gtt 1400 units/hr 3. Check an 6 hour heparin level 4. Daily heparin level and CBC 5. F/u plans for oral anticoagulation  Randy Castrejon, Drake Leachachel Lynn 08/27/2013,4:58 PM

## 2013-08-27 NOTE — Consult Note (Addendum)
Name: Isaiah Payne MRN: [id number] DOB: [birth year]    ADMISSION DATE:  08/27/2013 CONSULTATION DATE:  08/27/2013  REFERRING MD :  Rito Ehrlich PRIMARY SERVICE:  TRH  CHIEF COMPLAINT:  PE  BRIEF PATIENT DESCRIPTION: 38 y.o. [place] male with prior DVT in 2007, has had SOB x 7 - 8 months as well as recent flight from [place].  Seen in office by Dr. Sherene Sires who ordered CTA which revealed bilateral PE.  He was admitted by [facility] to SDU and PCCM was consulted.  SIGNIFICANT EVENTS / STUDIES:  CTA 8/12 >>> bilateral saddle PE with CT evidence of right heart strain. Echo 8/13 >>> Doppler legs 8/13>>>  LINES / TUBES: PIV  CULTURES: None  ANTIBIOTICS: None  HISTORY OF PRESENT ILLNESS:  Isaiah Payne is a 38 y.o. M from [place] who has a hx of prior DVT in 2007 for which he was on anticoagulation for roughly one year.  He has been having SOB primarily with exertion for the past 7 - 8 months.  In [place] he had a workup that was questionable for [facility]?, normal D-Dimer at the time, and blood work suggested protein S deficiency. He has had a recent flight back from [place] in the past 1 week and since then SOB has worsened. He was seen by PCP who referred him to pulmonology.  He was seen on 8/11 by Dr. Sherene Sires who ordered a CTA for 8/12.  CTA was performed and it revealed bilateral PE with saddle emboli, CT evidence of right heart strain. He was admitted by [facility] to the SDU and started on heparin.  PCCM was consulted. He denies any smoking history, no hx of cancer, no family hx of clotting disorders.  PAST MEDICAL HISTORY :  Past Medical History  Diagnosis Date  . DVT (deep venous thrombosis) 2007    Left leg- txed with warfarin x 1 yr   History reviewed. No pertinent past surgical history. Prior to Admission medications   Medication Sig Start Date End Date Taking? Authorizing Provider  aspirin 81 MG tablet Take 81 mg by mouth daily as needed for pain.    Yes Historical Provider, MD  famotidine (PEPCID) 20 MG  tablet Take 20 mg by mouth at bedtime.   Yes Historical Provider, MD  pantoprazole (PROTONIX) 40 MG tablet Take 1 tablet (40 mg total) by mouth daily. Take 30-60 min before first meal of the day 08/26/13  Yes Nyoka Cowden, MD   No Known Allergies  FAMILY HISTORY:  Family History  Problem Relation Age of Onset  . Transient ischemic attack Father    SOCIAL HISTORY:  reports that he has never smoked. He has never used smokeless tobacco. He reports that he drinks alcohol. He reports that he does not use illicit drugs.  REVIEW OF SYSTEMS:  All negative; except for those that are bolded, which indicate positives.  Constitutional: weight loss, weight gain, night sweats, fevers, chills, fatigue, weakness.  HEENT: headaches, sore throat, sneezing, nasal congestion, post nasal drip, difficulty swallowing, tooth/dental problems, visual complaints, visual changes, ear aches. Neuro: difficulty with speech, weakness, numbness, ataxia. CV:  chest pain, orthopnea, PND, swelling in lower extremities, dizziness, palpitations, syncope.  Resp: cough, hemoptysis, dyspnea, wheezing. GI  heartburn, indigestion, abdominal pain, nausea, vomiting, diarrhea, constipation, change in bowel habits, loss of appetite, hematemesis, melena, hematochezia.  GU: dysuria, change in color of urine, urgency or frequency, flank pain, hematuria. MSK: joint pain or swelling, decreased range of motion. Psych: change in mood or affect, depression,  anxiety, suicidal ideations, homicidal ideations. Skin: rash, itching, bruising.   SUBJECTIVE:  Denies chest pain, SOB.  States that breathing is comfortable at the moment and that he only really gets SOB with exertion.  VITAL SIGNS: Temp:  [97.3 F (36.3 C)-97.9 F (36.6 C)] 97.3 F (36.3 C) (08/12 1809) Pulse Rate:  [68-83] 81 (08/12 1809) Resp:  [11-29] 21 (08/12 1809) BP: (101-130)/(56-87) 130/87 mmHg (08/12 1809) SpO2:  [96 %-99 %] 96 % (08/12 1809) Weight:  [79.6 kg (175  lb 7.8 oz)-81.647 kg (180 lb)] 79.6 kg (175 lb 7.8 oz) (08/12 1809)  PHYSICAL EXAMINATION: General: Pleasant young male, resting in bed, in NAD. Neuro: A&O x 3, non-focal.  HEENT: [place]/AT. PERRL, sclerae anicteric. Cardiovascular: RRR, no M/R/G. , no rt heeve Lungs: Respirations even and unlabored.  CTA bilaterally, No W/R/R.  Abdomen: BS x 4, soft, NT/ND.  Musculoskeletal: No gross deformities, no edema.  Skin: Intact, warm, no rashes.     Recent Labs Lab 08/26/13 1258 08/27/13 1608  NA 138 140  K 4.5 4.3  CL 104 102  CO2 24 24  BUN 9 9  CREATININE 1.0 0.90  GLUCOSE 81 82    Recent Labs Lab 08/26/13 1258 08/27/13 1608  HGB 15.2 14.4  HCT 45.9 41.8  WBC 7.1 6.9  PLT 153.0 139*   Ct Angio Chest W/cm &/or Wo Cm  08/27/2013   CLINICAL DATA:  Shortness of breath with exertion for 7-8 months, history DVT, question pulmonary embolism  EXAM: CT ANGIOGRAPHY CHEST WITH CONTRAST  TECHNIQUE: Multidetector CT imaging of the chest was performed using the standard protocol during bolus administration of intravenous contrast. Multiplanar CT image reconstructions and MIPs were obtained to evaluate the vascular anatomy.  CONTRAST:  80mL OMNIPAQUE IOHEXOL 350 MG/ML SOLN  COMPARISON:  CT chest 04/30/2013  FINDINGS: Aorta normal caliber without aneurysm or dissection.  Large BILATERAL pulmonary emboli identified.  Saddle embolus at bifurcation of RIGHT main pulmonary artery extending into RIGHT upper lobe, RIGHT lower lobe and RIGHT middle lobe.  Large saddle embolus at bifurcation of LEFT pulmonary artery extending into LEFT upper and LEFT lower lobes.  Associated increased RV/LV ratio of 1.36 indicative of RIGHT heart strain and at least submassive pulmonary embolism.  Normal-size lymph nodes at the hila bilaterally.  Visualized portion of upper abdomen normal appearance.  Minimal pericardial fluid inferiorly.  Lungs clear.  No pleural effusion or pneumothorax.  Osseous structures unremarkable.   Review of the MIP images confirms the above findings.  IMPRESSION: BILATERAL pulmonary emboli including saddle emboli at the bifurcations of the LEFT and RIGHT pulmonary arteries extending into all lobes.  CT evidence of right heartstrain (RV/LV Ratio = 1.36) consistent with at least submassive (intermediate risk) PE.  The presence of right heart strain has been associated with an increased risk of morbidity and mortality.  Critical Value/emergent results were called by telephone at the time of interpretation on 08/27/2013 at 2:27 pm to Dr. Sandrea HughsMICHAEL WERT , who verbally acknowledged these results.   Electronically Signed   By: Ulyses SouthwardMark  Boles M.D.   On: 08/27/2013 14:32    ASSESSMENT / PLAN:  Bilateral PE - CT evidence of right heart strain Prot S deficiency per patient Plan: No role for thrombolytics as pt is very comfortable and vitals stable. No role for IR catheter local lytics Continue systemic heparin. Bridge with either Coumadin or other NOAC.  Pt would prefer novel agent (was on Coumadin for DVT in 2007 and did not like the  hassle of blood monitoring etc). However, in setting prot S defiency some patients fail typical INR goals and require higher INR goals such as 3-3.5 therefore unable to do with novel agents: Would prefer coumadin for this reason if patient would consent to use Will need lifelong anticoagulation. 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 F/u on LE dopplers ensure no mobile clot, if present = filter x 4-6 weeks, if clot not mobile would absolutely avoid filter with prot S def F/u on hypercoagulable panel: factor V leiden, lupus anticoagulant, prothrombin gene mutation, homocysteine, antithrombin III, protein C, protein S.--> please note with clot burden and heparin prot s, c , at3 NOT accurate, will need repeat on oral agent as outpt in 6 weeks O2 support, avoid sats less 93% as Pa pressures  will rise With rv strain on CT, would HOLD oral agents tonight  Rutherford Guys, PA - [facility] [place] [facility] Pgr: [phone]  or [phone]  I have fully examined this patient and agree with above findings.    And edited infull  Mcarthur Rossetti. Tyson Alias, MD, FACP Pgr: [phone]  Pulmonary & Critical Care

## 2013-08-27 NOTE — ED Notes (Signed)
Admitting MD at bedside.

## 2013-08-28 ENCOUNTER — Inpatient Hospital Stay (HOSPITAL_COMMUNITY): Payer: BC Managed Care – PPO

## 2013-08-28 ENCOUNTER — Other Ambulatory Visit: Payer: BC Managed Care – PPO

## 2013-08-28 ENCOUNTER — Inpatient Hospital Stay: Admission: RE | Admit: 2013-08-28 | Payer: BC Managed Care – PPO | Source: Ambulatory Visit

## 2013-08-28 ENCOUNTER — Encounter (HOSPITAL_COMMUNITY): Payer: Self-pay | Admitting: Radiology

## 2013-08-28 DIAGNOSIS — I2699 Other pulmonary embolism without acute cor pulmonale: Secondary | ICD-10-CM

## 2013-08-28 DIAGNOSIS — I82409 Acute embolism and thrombosis of unspecified deep veins of unspecified lower extremity: Secondary | ICD-10-CM

## 2013-08-28 LAB — PROTEIN C ACTIVITY: Protein C Activity: 121 % (ref 75–133)

## 2013-08-28 LAB — LUPUS ANTICOAGULANT PANEL
DRVVT: 34.7 secs (ref <42.9)
Lupus Anticoagulant: DETECTED — AB
PTT Lupus Anticoagulant: 200 secs — ABNORMAL HIGH (ref 28.0–43.0)
PTTLA 4:1 Mix: 200 secs — ABNORMAL HIGH (ref 28.0–43.0)
PTTLA Confirmation: 11.1 secs — ABNORMAL HIGH (ref <8.0)

## 2013-08-28 LAB — CBC
HCT: 40.8 % (ref 39.0–52.0)
Hemoglobin: 13.7 g/dL (ref 13.0–17.0)
MCH: 27 pg (ref 26.0–34.0)
MCHC: 33.6 g/dL (ref 30.0–36.0)
MCV: 80.5 fL (ref 78.0–100.0)
Platelets: 138 10*3/uL — ABNORMAL LOW (ref 150–400)
RBC: 5.07 MIL/uL (ref 4.22–5.81)
RDW: 14.1 % (ref 11.5–15.5)
WBC: 5.7 10*3/uL (ref 4.0–10.5)

## 2013-08-28 LAB — COMPREHENSIVE METABOLIC PANEL
ALT: 32 U/L (ref 0–53)
AST: 23 U/L (ref 0–37)
Albumin: 3.3 g/dL — ABNORMAL LOW (ref 3.5–5.2)
Alkaline Phosphatase: 88 U/L (ref 39–117)
Anion gap: 12 (ref 5–15)
BUN: 11 mg/dL (ref 6–23)
CO2: 22 mEq/L (ref 19–32)
Calcium: 8.7 mg/dL (ref 8.4–10.5)
Chloride: 106 mEq/L (ref 96–112)
Creatinine, Ser: 0.82 mg/dL (ref 0.50–1.35)
GFR calc Af Amer: >90 mL/min (ref >90)
GFR calc non Af Amer: >90 mL/min (ref >90)
Glucose, Bld: 100 mg/dL — ABNORMAL HIGH (ref 70–99)
Potassium: 4.1 mEq/L (ref 3.7–5.3)
Sodium: 140 mEq/L (ref 137–147)
Total Bilirubin: 1.6 mg/dL — ABNORMAL HIGH (ref 0.3–1.2)
Total Protein: 6.7 g/dL (ref 6.0–8.3)

## 2013-08-28 LAB — CARDIOLIPIN ANTIBODIES, IGG, IGM, IGA
Anticardiolipin IgA: 5 APL U/mL — ABNORMAL LOW (ref <22)
Anticardiolipin IgG: 4 GPL U/mL — ABNORMAL LOW (ref <23)
Anticardiolipin IgM: 3 MPL U/mL — ABNORMAL LOW (ref <11)

## 2013-08-28 LAB — BETA-2-GLYCOPROTEIN I ABS, IGG/M/A
Beta-2 Glyco I IgG: 0 G Units (ref <20)
Beta-2-Glycoprotein I IgA: 12 A Units (ref <20)
Beta-2-Glycoprotein I IgM: 6 M Units (ref <20)

## 2013-08-28 LAB — TROPONIN I: Troponin I: <0.3 ng/mL (ref <0.30)

## 2013-08-28 LAB — HEPARIN LEVEL (UNFRACTIONATED)
Heparin Unfractionated: 0.42 IU/mL (ref 0.30–0.70)
Heparin Unfractionated: 0.79 IU/mL — ABNORMAL HIGH (ref 0.30–0.70)
Heparin Unfractionated: 1.02 IU/mL — ABNORMAL HIGH (ref 0.30–0.70)

## 2013-08-28 LAB — PROTEIN S ACTIVITY: Protein S Activity: 93 % (ref 69–129)

## 2013-08-28 MED ORDER — HEPARIN (PORCINE) IN NACL 100-0.45 UNIT/ML-% IJ SOLN
1250.0000 [IU]/h | INTRAMUSCULAR | Status: DC
  Administered 2013-08-28 – 2013-08-29 (×2): 1250 [IU]/h via INTRAVENOUS
  Administered 2013-08-29 – 2013-08-30 (×2): 1350 [IU]/h via INTRAVENOUS
  Filled 2013-08-28 (×2): qty 250

## 2013-08-28 MED ORDER — RIVAROXABAN 15 MG PO TABS
15.0000 mg | ORAL_TABLET | Freq: Two times a day (BID) | ORAL | Status: DC
  Filled 2013-08-28: qty 1

## 2013-08-28 NOTE — Progress Notes (Signed)
Echocardiogram 2D Echocardiogram has been performed.  Isaiah Payne 08/28/2013, 10:40 AM
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
## 2013-08-28 NOTE — Progress Notes (Signed)
ANTICOAGULATION CONSULT NOTE - Follow Up Consult  Pharmacy Consult for heparin  Indication: saddle PE   No Known Allergies  Patient Measurements: Height: 6' (182.9 cm) Weight: 175 lb 7.8 oz (79.6 kg) IBW/kg (Calculated) : 77.6 Heparin Dosing Weight:   Vital Signs: Temp: 97.6 F (36.4 C) (08/12 2000) Temp src: Oral (08/12 2000) BP: 110/78 mmHg (08/12 2100) Pulse Rate: 87 (08/12 2100)  Labs:  Recent Labs  08/26/13 1258 08/27/13 1608 08/27/13 2333  HGB 15.2 14.4  --   HCT 45.9 41.8  --   PLT 153.0 139*  --   HEPARINUNFRC  --   --  0.79*  CREATININE 1.0 0.90  --   TROPONINI  --  <0.30  --     Estimated Creatinine Clearance: 122.1 ml/min (by C-G formula based on Cr of 0.9).   Medications:  Heparin at 1400/hr after 5000 unit bolus.   Assessment: Heparin level is 0.79 with no bleeding noted. May be possible bolus effect. Will repeat HL at am labs and adjust if HL remains above goal range. Will size of clot burden and RH strain higher end of goal is appropriate.   Goal of Therapy:  Heparin level 0.3-0.7 units/ml Monitor platelets by anticoagulation protocol: Yes   Plan:  Continue at 1400 units/hr until am labs results   Janice Coffinarl, Excell Neyland Jonathan 08/28/2013,12:19 AM

## 2013-08-28 NOTE — Progress Notes (Addendum)
ANTICOAGULATION CONSULT NOTE - Follow Up Consult  Pharmacy Consult for heparin  Indication: saddle PE   No Known Allergies  Patient Measurements: Height: 6' (182.9 cm) Weight: 175 lb 7.8 oz (79.6 kg) IBW/kg (Calculated) : 77.6 Heparin Dosing Weight: 79  Vital Signs: Temp: 97.4 F (36.3 C) (08/13 0817) Temp src: Oral (08/13 0817) BP: 103/70 mmHg (08/13 0817) Pulse Rate: 63 (08/13 0817)  Labs:  Recent Labs  08/26/13 1258 08/27/13 1608 08/27/13 2333 08/28/13 0315 08/28/13 0805  HGB 15.2 14.4  --  13.7  --   HCT 45.9 41.8  --  40.8  --   PLT 153.0 139*  --  138*  --   HEPARINUNFRC  --   --  0.79*  --  1.02*  CREATININE 1.0 0.90  --  0.82  --   TROPONINI  --  <0.30  --  <0.30  --     Estimated Creatinine Clearance: 134.1 ml/min (by C-G formula based on Cr of 0.82).   Medications:  Heparin at 1400/hr.    Assessment: [age] YOM with saddle PE and RH strain after long flight to [place]. Targeting upper end of goal due to clot burden and RH strain. Repeat heparin level shows some accumulation with elevated level of 1.02 on 1400 units/hr.  No bleeding noted.    Goal of Therapy:  Heparin level 0.3-0.7 units/ml Monitor platelets by anticoagulation protocol: Yes   Plan:  Decrease heparin to 1200 units/hr.  Recheck heparin level in 6 hours.   Link SnufferJessica Yulia Ulrich, PharmD, BCPS Clinical Pharmacist [phone] 08/28/2013,9:34 AM   Addendum: Patient to switch from IV heparin to Xarelto on 08/29/13 AM.  CrCl >100 ml/min. Protein S deficiency per patient - sees Dr. Gerrit HallsSehbai (oncologist) who agrees with Xarelto per Dr. Doristine ChurchSingh's progress note.   Plan: 1. Heparin to d/c when 1st dose of Xarelto is given on 08/29/13 at 0700AM (stop date on order). 2. Xarelto 15 BID with food x 21 days, then 20 mg once daily with food.  3. Continue to follow confirmatory level for Heparin this PM. Discontinued daily levels.   Link SnufferJessica Taite Baldassari, PharmD, BCPS Clinical Pharmacist [phone] 08/28/2013, 12:59  PM

## 2013-08-28 NOTE — Progress Notes (Signed)
*  PRELIMINARY RESULTS* Vascular Ultrasound Lower extremity venous duplex has been completed.  Preliminary findings: Evidence of DVT of the right popliteal vein and proximal posterior tibial veins. Baker's cyst also noted on the right. Evidence of partial Focal DVT of the left popliteal vein.  Farrel DemarkJill Eunice, RDMS, RVT  08/28/2013, 10:46 AM

## 2013-08-28 NOTE — Progress Notes (Addendum)
Name: Isaiah Payne MRN: [id number] DOB: Dec 04, 1975    ADMISSION DATE:  08/27/2013 CONSULTATION DATE:  08/27/2013  REFERRING MD :  Rito EhrlichKrishnan PRIMARY SERVICE:  TRH  CHIEF COMPLAINT:  PE  BRIEF PATIENT DESCRIPTION: 38 y.o. [place] male with prior DVT in 2007, has had SOB x 7 - 8 months as well as recent flight from [place].  Seen in office by Dr. Sherene SiresWert who ordered CTA which revealed bilateral PE.  [facility] and PCCM was consulted.  SIGNIFICANT EVENTS / STUDIES:  CTA 8/12 >>> bilateral saddle PE with CT evidence of right heart strain. Echo 8/13 >>> Doppler legs 8/13>>>BLE R>L DVT  LINES / TUBES: PIV  CULTURES: None  ANTIBIOTICS: None   SUBJECTIVE:  Denies chest pain, SOB.  Feels "bad" but no specific c/o.    VITAL SIGNS: Temp:  [97.3 F (36.3 C)-97.9 F (36.6 C)] 97.4 F (36.3 C) (08/13 0817) Pulse Rate:  [63-87] 63 (08/13 0817) Resp:  [11-29] 22 (08/13 0817) BP: (101-136)/(37-87) 103/70 mmHg (08/13 0817) SpO2:  [96 %-99 %] 97 % (08/13 0817) Weight:  [175 lb 7.8 oz (79.6 kg)-180 lb (81.647 kg)] 175 lb 7.8 oz (79.6 kg) (08/12 1809)  PHYSICAL EXAMINATION: General: very pleasant young male, resting in bed, in NAD. Neuro: A&O x 3, non-focal.  HEENT: [place]/AT. PERRL, sclerae anicteric. Cardiovascular: RRR, no M/R/G. , no rt heeve Lungs: Respirations even and unlabored on RA.  CTA bilaterally, No W/R/R.  Abdomen: BS x 4, soft, NT/ND.  Musculoskeletal: No gross deformities, no edema.     Recent Labs Lab 08/26/13 1258 08/27/13 1608 08/28/13 0315  NA 138 140 140  K 4.5 4.3 4.1  CL 104 102 106  CO2 24 24 22   BUN 9 9 11   CREATININE 1.0 0.90 0.82  GLUCOSE 81 82 100*    Recent Labs Lab 08/26/13 1258 08/27/13 1608 08/28/13 0315  HGB 15.2 14.4 13.7  HCT 45.9 41.8 40.8  WBC 7.1 6.9 5.7  PLT 153.0 139* 138*   Ct Angio Chest W/cm &/or Wo Cm  08/27/2013   CLINICAL DATA:  Shortness of breath with exertion for 7-8 months, history DVT, question  pulmonary embolism  EXAM: CT ANGIOGRAPHY CHEST WITH CONTRAST  TECHNIQUE: Multidetector CT imaging of the chest was performed using the standard protocol during bolus administration of intravenous contrast. Multiplanar CT image reconstructions and MIPs were obtained to evaluate the vascular anatomy.  CONTRAST:  80mL OMNIPAQUE IOHEXOL 350 MG/ML SOLN  COMPARISON:  CT chest 04/30/2013  FINDINGS: Aorta normal caliber without aneurysm or dissection.  Large BILATERAL pulmonary emboli identified.  Saddle embolus at bifurcation of RIGHT main pulmonary artery extending into RIGHT upper lobe, RIGHT lower lobe and RIGHT middle lobe.  Large saddle embolus at bifurcation of LEFT pulmonary artery extending into LEFT upper and LEFT lower lobes.  Associated increased RV/LV ratio of 1.36 indicative of RIGHT heart strain and at least submassive pulmonary embolism.  Normal-size lymph nodes at the hila bilaterally.  Visualized portion of upper abdomen normal appearance.  Minimal pericardial fluid inferiorly.  Lungs clear.  No pleural effusion or pneumothorax.  Osseous structures unremarkable.  Review of the MIP images confirms the above findings.  IMPRESSION: BILATERAL pulmonary emboli including saddle emboli at the bifurcations of the LEFT and RIGHT pulmonary arteries extending into all lobes.  CT evidence of right heartstrain (RV/LV Ratio = 1.36) consistent with at least submassive (intermediate risk) PE.  The presence of right heart strain has been associated with an  increased risk of morbidity and mortality.  Critical Value/emergent results were called by telephone at the time of interpretation on 08/27/2013 at 2:27 pm to Dr. Sandrea Hughs , who verbally acknowledged these results.   Electronically Signed   By: Ulyses Southward M.D.   On: 08/27/2013 14:32   Dg Chest Port 1 View  08/28/2013   CLINICAL DATA:  Evaluate for atelectasis or infarct. Bilateral pulmonary emboli.  EXAM: PORTABLE CHEST - 1 VIEW  COMPARISON:  08/27/2013 CTA   FINDINGS: Midline trachea. Borderline cardiomegaly. Insert pleural Clear lungs.  IMPRESSION: Borderline cardiomegaly, without acute disease.   Electronically Signed   By: Jeronimo Greaves M.D.   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 -Bridge with either Coumadin or other NOAC.-- Pt would prefer novel agent (was on Coumadin for DVT in 2007 and did not like the hassle of blood monitoring etc).  -Will need lifelong anticoagulation. 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 I have reviewed this patient's available data, including medical history, events of note, physical examination and test results as part of my evaluation.  Attending:  I have seen and examined the patient with nurse practitioner/resident and agree with the note above.   I had a lengthy conversation with him today.  Apparently there was some concern about the possibility of PH prior to his trip to [place] and the current PE.  In fact, he had a doppler study of his legs in [place] which was negative, but he was told he likely had pulmonary hypertension.    If is echocardiogram shows right heart strain then he needs catheter directed thrombolysis, primarily because I fear he may have underlying pulmonary hypertension.  Not sure if he has protein  S deficiency or not.    He needs lifelong anticoagulation.  I have found case reports in the literature of patients with Protein S deficiency who failed warfarin and were successfully treated with Xarelto.  Plan for today: F/u echo, if RV strain> catheter directed thrombolysis  Heber Wetherington, MD [place] [facility] Pager: [phone] Cell: [phone] If no response, call [phone]

## 2013-08-28 NOTE — Progress Notes (Addendum)
ANTICOAGULATION CONSULT NOTE - Follow Up Consult   HL = 0.43 (goal 0.3 - 0.7 units/mL) Heparin dosing weight = 79 kg   Assessment: 38 YOM with new saddle PE and history of DVT and Protein S deficiency to continue on IV heparin.  Xarelto from PTA currently on hold for planned PE lysis.  Heparin level decreased to therapeutic level post rate adjustment this AM.  No bleeding reported.   Plan: - Increase heparin gtt slightly to 1250 units/hr, aiming for higher end of goal given clot burden - F/U AM labs    Johnisha Louks D. Laney Potashang, PharmD, BCPS Pager:  [phone] 08/28/2013, 5:54 PM

## 2013-08-28 NOTE — Progress Notes (Signed)
Report received from [facility] RN for transfer to [id number]

## 2013-08-28 NOTE — Progress Notes (Signed)
NURSING PROGRESS NOTE  Jacinto ReapRoshan Siemen [id number] Transfer Data: 08/28/2013 4:48 PM Attending Provider: Leroy SeaPrashant K Singh, MD WUJ:WJXBJYN,WGNFAPCP:KOIRALA,DIBAS, MD Code Status: Full  Jacinto ReapRoshan Chilton is a 38 y.o. male patient transferred from 2C -No acute distress noted.  -No complaints of shortness of breath.  -No complaints of chest pain.   Cardiac Monitoring: Box #[id number] in place. Cardiac monitor yields:normal sinus rhythm.  Blood pressure 101/67, pulse 66, temperature 98 F (36.7 C), temperature source Oral, resp. rate 18, height 6' (1.829 m), weight 79.6 kg (175 lb 7.8 oz), SpO2 97.00%.   IV Fluids:  IV in place, occlusive dsg intact without redness, IV cath  Allergies:  Review of patient's allergies indicates no known allergies.  Past Medical History:   has a past medical history of DVT (deep venous thrombosis) (2007).  Past Surgical History:   has no past surgical history on file.  Social History:   reports that he has never smoked. He has never used smokeless tobacco. He reports that he drinks alcohol. He reports that he does not use illicit drugs.  Skin: intact  Patient/Family orientated to room. Information packet given to patient/family. Admission inpatient armband information verified with patient/family to include name and date of birth and placed on patient arm. Side rails up x 2, fall assessment and education completed with patient/family. Patient/family able to verbalize understanding of risk associated with falls and verbalized understanding to call for assistance before getting out of bed. Call light within reach. Patient/family able to voice and demonstrate understanding of unit orientation instructions.    Will continue to evaluate and treat per MD orders.

## 2013-08-28 NOTE — Progress Notes (Signed)
Utilization Review Completed.  

## 2013-08-28 NOTE — Progress Notes (Signed)
Patient Demographics  Isaiah Payne Hally, is a 38 y.o. male, DOB - January 02, 1976, [id number]  Admit date - 08/27/2013   Admitting Physician Osvaldo ShipperGokul Krishnan, MD  Outpatient Primary MD for the patient is Darrow BussingKOIRALA,DIBAS, MD  LOS - 1   Chief Complaint  Patient presents with  . Shortness of Breath        Subjective:   Isaiah Payne Nou today has, No headache, No chest pain, No abdominal pain - No Nausea, No new weakness tingling or numbness, No Cough - SOB at rest.  Assessment & Plan    1. Acute massive pulmonary embolism with bilateral lower extremity DVT in a patient with history of protein S deficiency, recent long travel in the airplane to [place]. 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 Currently medically stable, pulmonary critical care following, on heparin drip, will transition to xaralto in the morning. Case management requested to evaluate the patient. Discussed his case with oncologist on call Dr.Sehbai who agrees with the xaralto, have called oncology office and they will set up a followup appointment this afternoon.     Echogram pending will await pulmonary input for any need of number lysis depending on RV strain.  ER anticoagulation panel pending.     2. Per patient he was diagnosed with pulmonary hypertension in [place]. TTE pending, he is set up to see Dr. Sherene SiresWert pulmonary, question if he's been getting small PEs intermittently due to his underlying hypercoagulable state. Will ambulate and evaluate for any oxygen need.    3.GERD - continue PPI.    4. Mild thrombocytopenia secondary to consumption from massive clot burden. No acute issue monitor.    5. T wave changes on EKG. Chest pain-free, likely strain pattern due to massive  PE and clot burden, troponin 2 sets negative. Echo pending we'll monitor.      Code Status: Full  Family Communication: None present  Disposition Plan: Home   Procedures CT angiogram chest, lower estimate the venous duplex, transthoracic echo gram   Consults  pulmonary care, discussed with oncologist Dr Gerrit HallsSehbai on call over the phone on 08/28/2013 okay for xaralto   Medications  Scheduled Meds: . famotidine  20 mg Oral QHS  . pantoprazole  40 mg Oral QAC breakfast  . sodium chloride  3 mL Intravenous Q12H   Continuous Infusions: . heparin 1,200 Units/hr (08/28/13 0941)   PRN Meds:.acetaminophen, albuterol, morphine injection, ondansetron (ZOFRAN) IV  DVT Prophylaxis  heparin drip  Lab Results  Component Value Date   PLT 138* 08/28/2013    Antibiotics    Anti-infectives   None          Objective:   Filed Vitals:   08/27/13 2100 08/28/13 0000 08/28/13 0330 08/28/13 0817  BP: 110/78 136/37  103/70  Pulse: 87 81  63  Temp:  97.8 F (36.6 C) 97.9 F (36.6 C) 97.4 F (36.3 C)  TempSrc:  Oral Oral Oral  Resp: 23 21  22   Height:      Weight:      SpO2: 97% 96%  97%    Wt Readings from Last 3 Encounters:  08/27/13 79.6 kg (175 lb 7.8 oz)  08/26/13 80.74 kg (178 lb)  07/12/06 81.693 kg (180 lb 1.6 oz)     Intake/Output Summary (Last 24 hours) at 08/28/13 1129 Last data filed at 08/28/13 0900  Gross per 24 hour  Intake   1620 ml  Output      0 ml  Net   1620 ml     Physical Exam  Awake Alert, Oriented X 3, No new F.N deficits, Normal affect Time.AT,PERRAL Supple Neck,No JVD, No cervical lymphadenopathy appriciated.  Symmetrical Chest wall movement, Good air movement bilaterally, CTAB RRR,No Gallops,Rubs or new Murmurs, No Parasternal Heave +ve B.Sounds, Abd Soft, No tenderness, No organomegaly appriciated, No rebound - guarding or rigidity. No Cyanosis, Clubbing or edema, No new Rash or bruise    Data Review   Micro Results Recent  Results (from the past 240 hour(s))  MRSA PCR SCREENING     Status: None   Collection Time    08/27/13  7:06 PM      Result Value Ref Range Status   MRSA by PCR NEGATIVE  NEGATIVE Final   Comment:            The GeneXpert MRSA Assay (FDA     approved for NASAL specimens     only), is one component of a     comprehensive MRSA colonization     surveillance program. It is not     intended to diagnose MRSA     infection nor to guide or     monitor treatment for     MRSA infections.    Radiology Reports Ct Angio Chest W/cm &/or Wo Cm  08/27/2013   CLINICAL DATA:  Shortness of breath with exertion for 7-8 months, history DVT, question pulmonary embolism  EXAM: CT ANGIOGRAPHY CHEST WITH CONTRAST  TECHNIQUE: Multidetector CT imaging of the chest was performed using the standard protocol during bolus administration of intravenous contrast. Multiplanar CT image reconstructions and MIPs were obtained to evaluate the vascular anatomy.  CONTRAST:  80mL OMNIPAQUE IOHEXOL 350 MG/ML SOLN  COMPARISON:  CT chest 04/30/2013  FINDINGS: Aorta normal caliber without aneurysm or dissection.  Large BILATERAL pulmonary emboli identified.  Saddle embolus at bifurcation of RIGHT main pulmonary artery extending into RIGHT upper lobe, RIGHT lower lobe and RIGHT middle lobe.  Large saddle embolus at bifurcation of LEFT pulmonary artery extending into LEFT upper and LEFT lower lobes.  Associated increased RV/LV ratio of 1.36 indicative of RIGHT heart strain and at least submassive pulmonary embolism.  Normal-size lymph nodes at the hila bilaterally.  Visualized portion of upper abdomen normal appearance.  Minimal pericardial fluid inferiorly.  Lungs clear.  No pleural effusion or pneumothorax.  Osseous structures unremarkable.  Review of the MIP images confirms the above findings.  IMPRESSION: BILATERAL pulmonary emboli including saddle emboli at the bifurcations of the LEFT and RIGHT pulmonary arteries extending into all  lobes.  CT evidence of right heartstrain (RV/LV Ratio = 1.36) consistent with at least submassive (intermediate risk) PE.  The presence of right heart strain has been associated with an increased risk of morbidity and mortality.  Critical Value/emergent results were called by telephone at the time of interpretation on 08/27/2013 at 2:27 pm to Dr. Sandrea Hughs , who verbally acknowledged these results.   Electronically Signed   By: Ulyses Southward M.D.   On: 08/27/2013 14:32   Dg Chest Port 1 View  08/28/2013   CLINICAL DATA:  Evaluate for atelectasis or infarct. Bilateral pulmonary emboli.  EXAM: PORTABLE CHEST - 1 VIEW  COMPARISON:  08/27/2013  CTA  FINDINGS: Midline trachea. Borderline cardiomegaly. Insert pleural Clear lungs.  IMPRESSION: Borderline cardiomegaly, without acute disease.   Electronically Signed   By: Jeronimo Greaves M.D.   On: 08/28/2013 08:10    CBC  Recent Labs Lab 08/26/13 1258 08/27/13 1608 08/28/13 0315  WBC 7.1 6.9 5.7  HGB 15.2 14.4 13.7  HCT 45.9 41.8 40.8  PLT 153.0 139* 138*  MCV 82.9 81.3 80.5  MCH  --  28.0 27.0  MCHC 33.1 34.4 33.6  RDW 15.0 14.2 14.1  LYMPHSABS 2.8 2.5  --   MONOABS 0.6 0.6  --   EOSABS 0.3 0.3  --   BASOSABS 0.0 0.1  --     Chemistries   Recent Labs Lab 08/26/13 1258 08/27/13 1608 08/28/13 0315  NA 138 140 140  K 4.5 4.3 4.1  CL 104 102 106  CO2 24 24 22   GLUCOSE 81 82 100*  BUN 9 9 11   CREATININE 1.0 0.90 0.82  CALCIUM 9.5 9.2 8.7  AST  --   --  23  ALT  --   --  32  ALKPHOS  --   --  88  BILITOT  --   --  1.6*   ------------------------------------------------------------------------------------------------------------------ estimated creatinine clearance is 134.1 ml/min (by C-G formula based on Cr of 0.82). ------------------------------------------------------------------------------------------------------------------ No results found for this basename: HGBA1C,  in the last 72  hours ------------------------------------------------------------------------------------------------------------------ No results found for this basename: CHOL, HDL, LDLCALC, TRIG, CHOLHDL, LDLDIRECT,  in the last 72 hours ------------------------------------------------------------------------------------------------------------------  Recent Labs  08/26/13 1258  TSH 1.32   ------------------------------------------------------------------------------------------------------------------ No results found for this basename: VITAMINB12, FOLATE, FERRITIN, TIBC, IRON, RETICCTPCT,  in the last 72 hours  Coagulation profile No results found for this basename: INR, PROTIME,  in the last 168 hours   Recent Labs  08/26/13 1258  DDIMER 5.45*    Cardiac Enzymes  Recent Labs Lab 08/27/13 1608 08/28/13 0315  TROPONINI <0.30 <0.30   ------------------------------------------------------------------------------------------------------------------ No components found with this basename: POCBNP,      Time Spent in minutes   35   Susa Raring K M.D on 08/28/2013 at 11:29 AM  Between 7am to 7pm - Pager - [phone]  After 7pm go to [email or web address] - password [email or web address]  And look for the night coverage person covering for me after hours  [facility] Office  [phone]   **Disclaimer: This note may have been dictated with voice recognition software. Similar sounding words can inadvertently be transcribed and this note may contain transcription errors which may not have been corrected upon publication of note.**

## 2013-08-29 ENCOUNTER — Inpatient Hospital Stay (HOSPITAL_COMMUNITY): Payer: BC Managed Care – PPO

## 2013-08-29 DIAGNOSIS — Z7901 Long term (current) use of anticoagulants: Secondary | ICD-10-CM

## 2013-08-29 DIAGNOSIS — D6859 Other primary thrombophilia: Secondary | ICD-10-CM

## 2013-08-29 DIAGNOSIS — R911 Solitary pulmonary nodule: Secondary | ICD-10-CM

## 2013-08-29 DIAGNOSIS — D696 Thrombocytopenia, unspecified: Secondary | ICD-10-CM

## 2013-08-29 LAB — CBC
HCT: 43 % (ref 39.0–52.0)
HCT: 41.8 % (ref 39.0–52.0)
HCT: 39.7 % (ref 39.0–52.0)
Hemoglobin: 14.5 g/dL (ref 13.0–17.0)
Hemoglobin: 14.2 g/dL (ref 13.0–17.0)
Hemoglobin: 13.4 g/dL (ref 13.0–17.0)
MCH: 27.8 pg (ref 26.0–34.0)
MCH: 27.5 pg (ref 26.0–34.0)
MCH: 27.4 pg (ref 26.0–34.0)
MCHC: 33.7 g/dL (ref 30.0–36.0)
MCHC: 34 g/dL (ref 30.0–36.0)
MCHC: 33.8 g/dL (ref 30.0–36.0)
MCV: 82.5 fL (ref 78.0–100.0)
MCV: 81 fL (ref 78.0–100.0)
MCV: 81.2 fL (ref 78.0–100.0)
Platelets: 139 10*3/uL — ABNORMAL LOW (ref 150–400)
Platelets: 132 10*3/uL — ABNORMAL LOW (ref 150–400)
Platelets: 129 10*3/uL — ABNORMAL LOW (ref 150–400)
RBC: 5.21 MIL/uL (ref 4.22–5.81)
RBC: 5.16 MIL/uL (ref 4.22–5.81)
RBC: 4.89 MIL/uL (ref 4.22–5.81)
RDW: 14.2 % (ref 11.5–15.5)
RDW: 14.2 % (ref 11.5–15.5)
RDW: 14.1 % (ref 11.5–15.5)
WBC: 4.9 10*3/uL (ref 4.0–10.5)
WBC: 6 10*3/uL (ref 4.0–10.5)
WBC: 4.7 10*3/uL (ref 4.0–10.5)

## 2013-08-29 LAB — PROTEIN S, TOTAL: Protein S Ag, Total: 103 % (ref 60–150)

## 2013-08-29 LAB — FIBRINOGEN
Fibrinogen: 340 mg/dL (ref 204–475)
Fibrinogen: 324 mg/dL (ref 204–475)

## 2013-08-29 LAB — HEPARIN LEVEL (UNFRACTIONATED)
Heparin Unfractionated: 0.33 IU/mL (ref 0.30–0.70)
Heparin Unfractionated: 0.5 IU/mL (ref 0.30–0.70)
Heparin Unfractionated: 0.62 IU/mL (ref 0.30–0.70)

## 2013-08-29 LAB — PROTEIN C, TOTAL: Protein C, Total: 77 % (ref 72–160)

## 2013-08-29 MED ORDER — LORAZEPAM 2 MG/ML IJ SOLN: INTRAMUSCULAR | Status: AC | PRN

## 2013-08-29 MED ORDER — SODIUM CHLORIDE 0.9 % IV SOLN
12.0000 mg | Freq: Once | INTRAVENOUS | Status: AC
  Administered 2013-08-29: 12 mg via INTRAVENOUS
  Filled 2013-08-29: qty 12

## 2013-08-29 MED ORDER — SODIUM CHLORIDE 0.9 % IJ SOLN: 3.0000 mL | INTRAMUSCULAR | Status: DC | PRN

## 2013-08-29 MED ORDER — SODIUM CHLORIDE 0.9 % IV SOLN: 12.0000 mg | Freq: Once | INTRAVENOUS | Status: DC

## 2013-08-29 MED ORDER — ALTEPLASE 50 MG IV SOLR: 12.0000 mg | Freq: Once | INTRAVENOUS | Status: DC

## 2013-08-29 MED ORDER — SODIUM CHLORIDE 0.9 % IV SOLN
INTRAVENOUS | Status: DC
  Administered 2013-08-29: 16:00:00 via INTRAVENOUS

## 2013-08-29 MED ORDER — SODIUM CHLORIDE 0.9 % IV SOLN
INTRAVENOUS | Status: DC
  Administered 2013-08-29: 10 mL/h via INTRAVENOUS
  Administered 2013-08-30: 03:00:00 via INTRAVENOUS

## 2013-08-29 MED ORDER — SODIUM CHLORIDE 0.9 % IV SOLN: INTRAVENOUS | Status: DC

## 2013-08-29 MED ORDER — SODIUM CHLORIDE 0.9 % IV SOLN
250.0000 mL | INTRAVENOUS | Status: DC | PRN
  Administered 2013-08-30: 250 mL via INTRAVENOUS

## 2013-08-29 MED ORDER — FENTANYL CITRATE 0.05 MG/ML IJ SOLN
INTRAMUSCULAR | Status: AC | PRN
  Administered 2013-08-29 (×2): 25 ug via INTRAVENOUS

## 2013-08-29 MED ORDER — LIDOCAINE HCL 1 % IJ SOLN
INTRAMUSCULAR | Status: AC
  Filled 2013-08-29: qty 20

## 2013-08-29 MED ORDER — SODIUM CHLORIDE 0.9 % IV SOLN
INTRAVENOUS | Status: DC
  Administered 2013-08-29 – 2013-08-30 (×2): via INTRAVENOUS

## 2013-08-29 MED ORDER — OXYCODONE-ACETAMINOPHEN 5-325 MG PO TABS
1.0000 | ORAL_TABLET | Freq: Four times a day (QID) | ORAL | Status: DC | PRN
  Administered 2013-08-29 – 2013-08-30 (×3): 1 via ORAL
  Administered 2013-08-30: 2 via ORAL
  Filled 2013-08-29: qty 1
  Filled 2013-08-29: qty 2
  Filled 2013-08-29 (×2): qty 1

## 2013-08-29 MED ORDER — MIDAZOLAM HCL 2 MG/2ML IJ SOLN
INTRAMUSCULAR | Status: AC | PRN
  Administered 2013-08-29 (×2): 0.5 mg via INTRAVENOUS

## 2013-08-29 MED ORDER — MORPHINE SULFATE 2 MG/ML IJ SOLN
1.0000 mg | INTRAMUSCULAR | Status: DC | PRN
  Administered 2013-08-30: 2 mg via INTRAVENOUS
  Filled 2013-08-29: qty 1

## 2013-08-29 MED ORDER — IOHEXOL 300 MG/ML  SOLN
50.0000 mL | Freq: Once | INTRAMUSCULAR | Status: AC | PRN
  Administered 2013-08-29: 10 mL via INTRAVENOUS

## 2013-08-29 MED ORDER — FENTANYL CITRATE 0.05 MG/ML IJ SOLN
INTRAMUSCULAR | Status: AC
Start: 2013-08-29 — End: 2013-08-30
  Filled 2013-08-29: qty 2

## 2013-08-29 MED ORDER — MIDAZOLAM HCL 2 MG/2ML IJ SOLN
INTRAMUSCULAR | Status: AC
  Filled 2013-08-29: qty 2

## 2013-08-29 MED ORDER — SODIUM CHLORIDE 0.9 % IJ SOLN
3.0000 mL | Freq: Two times a day (BID) | INTRAMUSCULAR | Status: DC
  Administered 2013-08-29 – 2013-08-31 (×3): 3 mL via INTRAVENOUS

## 2013-08-29 NOTE — Progress Notes (Signed)
Patient ID: Isaiah Payne, male   DOB: 12/27/1975, 38 y.o.   MRN: [id number]    Pt intends to move forward with Pulmonary embolus thrombolysis today  Dr Fredia SorrowYamagata will see pt prior to procedure

## 2013-08-29 NOTE — Consult Note (Signed)
[place]  Telephone:([phone]) [place] CONSULTATION NOTE  Isaiah Payne                                MR#: [id number]  DOB: 02/14/1975                                  CSN#: [id number]  Referring MD: [facility]    Primary MD:  Isaiah Amel, MD    Reason for Consult:  Hypercoagulable state.                                       Lupus anticoagulant positive                                        History of LLE DVT   QQP:[id number] [name] is a 38 y.o. male from [place] with a history of left lower extremity DVT in 2007 requiring Lovenox-> Coumadin 1 year. He was admitted from Pulmonary service to the hospital on 8/12, after returning from his home country 1 week ago with  A 7-8 month progressive shortness of breath requiring further evaluation.  He had been seen in [place] with complaints of left leg pain without swelling, and a doppler prior to his return, was negative for DVT. A CT angio at [facility] with an without contrast on 8/12 revealed bilateral PE including saddle emboli at the bifurcations of the LEFT and RIGHT pulmonary arteries extending into all lobe. This CT showed  evidence of right heartstrain (RV/LV Ratio = 1.36) consistent with at least submassive (intermediate risk) PE. He had diffuse T wave changes on his EKG. Due to the diagnosis of acute massive PE, being a second thromboembolic event in 8 years,he was placed on IV heparin per Pharmacy. Of note, a prior CT of the chest on 4/15 was positive for a small lung nodule with visible adenopathy (see report below) New dopplers on 8/13 were positive for bilateral, Right greater than Left DVT Hypercoagulable panel drawn prior to heparinization showed positive Lupus anticoagulant . Prot C activity is 121 and S activity is 93. AT III is normal at 107.  No recent surgeries. No recent trauma. Positive remote tobacco history. No history of heavy alcohol use. No family history of hematological  disorders. He was on baby  ASA daily but  No NSAIDs States that has never had a hematological evaluation prior to this admission. Never had a bone marrow biopsy.  Patient is for thrombolysis by IR today, given thrombus burden with right heart strain. We were asked to see the patient in consultation with recommendations   PMH:  Past Medical History  Diagnosis Date  . DVT (deep venous thrombosis) 2007    Left leg- txed with warfarin x 1 yr    GERD   History of left leg thrombophlebitis (December, 2006 and in 2005).   Seasonal Rhinitis   Known  Lung nodule with hilar adenopathy followed as outpatient  Surgeries:  History reviewed. No pertinent past surgical history.  Allergies: No Known Allergies  Medications:   Prior to Admission:  Prescriptions prior to admission  Medication Sig Dispense Refill  .  aspirin 81 MG tablet Take 81 mg by mouth daily as needed for pain.       . famotidine (PEPCID) 20 MG tablet Take 20 mg by mouth at bedtime.      . pantoprazole (PROTONIX) 40 MG tablet Take 1 tablet (40 mg total) by mouth daily. Take 30-60 min before first meal of the day  30 tablet  2    UEK:CMKLKJZPHXTAV, albuterol, morphine injection, ondansetron (ZOFRAN) IV  ROS: Constitutional: Denies fevers, chills or abnormal night sweats. Generalized fatigue.  Eyes: Denies blurriness of vision, double vision or watery eyes Ears, nose, mouth, throat, and face: Denies mucositis or sore throat Respiratory: Positive for dry cough, dyspnea as per HPI, no  wheezes Cardiovascular: Denies palpitation, chest discomfort or lower extremity swelling Gastrointestinal:  Denies nausea, heartburn or change in bowel habits Skin: Denies abnormal skin rashes. No swelling  Lymphatics: Denies new lymphadenopathy or easy bruising Neurological:Denies numbness, tingling or new weaknesses Behavioral/Psych: Mood is stable, no new changes  All other systems were reviewed with the patient and are  negative.   Family History:    Family History  Problem Relation Age of Onset  . Transient ischemic attack Father     No family history of hematological  disorders.  Social History:  reports that he is a former tobacco user. He reports that he drinks alcohol. He reports that he does not use illicit drugs.Married. Lives in [place]. Originally from [place]    Physical Exam    ECOG PERFORMANCE STATUS:  1 Symptomatic but completely ambulatory (Restricted in physically strenuous activity but ambulatory and able to carry out work of a light or sedentary nature.   Filed Vitals:   08/29/13 0422  BP: 103/64  Pulse: 56  Temp: 97.7 F (36.5 C)  Resp: 18   Filed Weights   08/27/13 1532 08/27/13 1809  Weight: 180 lb (81.647 kg) 175 lb 7.8 oz (79.6 kg)    GENERAL:alert, no distress and comfortable SKIN: skin color, texture, turgor are normal, no rashes or significant lesions EYES: normal, conjunctiva are pink and non-injected, sclera clear OROPHARYNX:no exudate, no erythema and lips, buccal mucosa, and tongue normal  NECK: supple, thyroid normal size, non-tender, without nodularity LYMPH:  no palpable lymphadenopathy in the cervical, axillary or inguinal LUNGS: clear to auscultation and percussion with normal breathing effort HEART: regular rate & rhythm and no murmurs and no lower extremity edema ABDOMEN:abdomen soft, non-tender and normal bowel sounds Musculoskeletal:no cyanosis of digits and no clubbing  PSYCH: alert & oriented x 3 with fluent speech NEURO: no focal motor/sensory deficits   Labs:  CBC   Recent Labs Lab 08/26/13 1258 08/27/13 1608 08/28/13 0315 08/29/13 0512  WBC 7.1 6.9 5.7 4.9  HGB 15.2 14.4 13.7 14.5  HCT 45.9 41.8 40.8 43.0  PLT 153.0 139* 138* 139*  MCV 82.9 81.3 80.5 82.5  MCH  --  28.0 27.0 27.8  MCHC 33.1 34.4 33.6 33.7  RDW 15.0 14.2 14.1 14.2  LYMPHSABS 2.8 2.5  --   --   MONOABS 0.6 0.6  --   --   EOSABS 0.3 0.3  --   --   BASOSABS  0.0 0.1  --   --      CMP    Recent Labs Lab 08/26/13 1258 08/27/13 1608 08/28/13 0315  NA 138 140 140  K 4.5 4.3 4.1  CL 104 102 106  CO2 $Re'24 24 22  'ITx$ GLUCOSE 81 82 100*  BUN $Re'9 9 11  'aRK$ CREATININE  1.0 0.90 0.82  CALCIUM 9.5 9.2 8.7  AST  --   --  23  ALT  --   --  32  ALKPHOS  --   --  88  BILITOT  --   --  1.6*        Component Value Date/Time   BILITOT 1.6* 08/28/2013 0315     No results found for this basename: INR, PROTIME,  in the last 168 hours   Recent Labs  08/26/13 1258  DDIMER 5.45*   Labs from 2012:   Antithrombin III 99. Protein C 68. Functional protein C 114. Protein S 82. Functional protein S 84. Lupus anticoagulants not detected. Sodium 137, potassium 3.8, chloride 103, CO2 29, glucose 88, BUN 8, creatinine 0.8, calcium 9.2, homocysteine 10.6. UDS positive for THC. Antinuclear antibody negative. Beta 2 electroprotein antibodies negative. Negative for prothrombin 2 gene mutation.   Anemia panel:  No results found for this basename: VITAMINB12, FOLATE, FERRITIN, TIBC, IRON, RETICCTPCT,  in the last 72 hours   Imaging Studies:  Ct Angio Chest W/cm &/or Wo Cm  08/27/2013   CLINICAL DATA:  Shortness of breath with exertion for 7-8 months, history DVT, question pulmonary embolism  EXAM: CT ANGIOGRAPHY CHEST WITH CONTRAST  TECHNIQUE: Multidetector CT imaging of the chest was performed using the standard protocol during bolus administration of intravenous contrast. Multiplanar CT image reconstructions and MIPs were obtained to evaluate the vascular anatomy.  CONTRAST:  45mL OMNIPAQUE IOHEXOL 350 MG/ML SOLN  COMPARISON:  CT chest 04/30/2013  FINDINGS: Aorta normal caliber without aneurysm or dissection.  Large BILATERAL pulmonary emboli identified.  Saddle embolus at bifurcation of RIGHT main pulmonary artery extending into RIGHT upper lobe, RIGHT lower lobe and RIGHT middle lobe.  Large saddle embolus at bifurcation of LEFT pulmonary artery extending into LEFT  upper and LEFT lower lobes.  Associated increased RV/LV ratio of 1.36 indicative of RIGHT heart strain and at least submassive pulmonary embolism.  Normal-size lymph nodes at the hila bilaterally.  Visualized portion of upper abdomen normal appearance.  Minimal pericardial fluid inferiorly.  Lungs clear.  No pleural effusion or pneumothorax.  Osseous structures unremarkable.  Review of the MIP images confirms the above findings.  IMPRESSION: BILATERAL pulmonary emboli including saddle emboli at the bifurcations of the LEFT and RIGHT pulmonary arteries extending into all lobes.  CT evidence of right heartstrain (RV/LV Ratio = 1.36) consistent with at least submassive (intermediate risk) PE.  The presence of right heart strain has been associated with an increased risk of morbidity and mortality.  Critical Value/emergent results were called by telephone at the time of interpretation on 08/27/2013 at 2:27 pm to Dr. Christinia Gully , who verbally acknowledged these results.   Electronically Signed   By: Lavonia Dana M.D.   On: 08/27/2013 14:32   Dg Chest Port 1 View  08/28/2013   CLINICAL DATA:  Evaluate for atelectasis or infarct. Bilateral pulmonary emboli.  EXAM: PORTABLE CHEST - 1 VIEW  COMPARISON:  08/27/2013 CTA  FINDINGS: Midline trachea. Borderline cardiomegaly. Insert pleural Clear lungs.  IMPRESSION: Borderline cardiomegaly, without acute disease.   Electronically Signed   By: Abigail Miyamoto M.D.   On: 08/28/2013 08:10   CT CHEST WITH CONTRAST 04/30/13  FINDINGS:  No suspicious nodule in the left hilar region as questioned on prior chest x-ray. There is a 4 mm pleural-based nodule along the posterior major fissure in the right upper lobe, likely intrapulmonary lymph node. No suspicious pulmonary nodules. No pleural  effusions. Lungs otherwise clear. Small and borderline sized prevascular and hilar lymph nodes. AP window lymph node on image 30 has a short axis diameter of 8 mm. Precarinal lymph node on image 31  has a short axis diameter of 11 mm. Left hilar lymph node on image 36 has a short axis diameter of 7 mm. No axillary adenopathy. Heart is normal size. Aorta is normal caliber. Chest wall soft tissues are unremarkable. Imaging into the upper abdomen shows no acute findings.  IMPRESSION:  4 mm subpleural nodule in the posterior inferior right upper lobe, likely intrapulmonary lymph node. If the patient is at high risk for bronchogenic carcinoma, follow-up chest CT at 1 year is recommended. If the patient is at low risk, no follow-up is needed. This recommendation follows the consensus statement: Guidelines for Management of Small Pulmonary Nodules Detected on CT Scans: A Statement from the [place] as published in Radiology  2005; 237:395-400.    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  He was found per CTA to have massive bilateral PE as well as bilateral, right greater than left DVT Patient had received anticoagulation with Coumadin for 1 year during his LLE DVT in 2007.   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  Full Hypercoagulable panel is pending including factor V leiden and Prot G mutation and Anticardiolipin antibody. Lupus anticoagulant was detected. Prot C activity is  121 and S activity is 93. AT III is normal at 107.  At this time, due to recurrence, patient will receive anticoagulation indefinitely, for the prevention of further thrombotic events, especially since a history of a lung nodule first noticed in 04/2013, which is being followed as outpatient. A follow up CT in 1 year period is recommended Anticoagulation therapies would include warfarin, low molecular weight heparin such as Lovenox or newer agents such as Rivaroxaban. Preventive measures such as avoiding cigarette smoking, keeping up-to-date with screening programs for early cancer detection, frequent ambulation for long distance travel and aggressive DVT prophylaxis in all surgical settings is recommended. An appointment will be arranged upon discharge, and will repeat panel as outpatient. Thank you for the referral.   Thrombocytopenia, mild Likely secondary to massive PE with PA hypertension and dilution.  No bleeding issues such as epistaxis or hematemesis or hematuria.  He is currently on heparin. Continue to monitor, no transfusion is indicated at this time  Full Code  Other rmedical issues as per admitting team.   Isaiah Jumbo, PA-C 08/29/2013 12:47 PM     Attending Note  I personally saw the patient, reviewed the chart and examined the patient. I agree with the assessment and plan as documented above.  Assessment and Plan:  1. Bil DVT and Massive PE: S/P TPA. hypercoagulablity work up was positive for lupus anticoagulant. I do not believe that the  Protein S is truly deficient. After a significant blood clot, Protein S can be low due to consumption. Either way, given patients prior H/o blood clot and the current massive PE and DVT, he will need life long anti-coagulation. Lupus anticoagulant testing will be repeated in 3 months to confim those results along with Protein S.  2. I recommend Xarelto since the patient is not very keen on frequent blood testing for discharge. Will  follow the patient 2 weeks post-discharge to monitor tolerability to Xarelto. Patient  Understands that all anticoagulants carry the risk of bleeding both minor and major.  Please call if there are any concerns. Otherwise I will follow him in my office in 2 weeks. Thank you very much for the consultation.   Signed Rulon Eisenmenger, MD

## 2013-08-29 NOTE — Progress Notes (Signed)
Report called Delsa Saleyril on 8M

## 2013-08-29 NOTE — Progress Notes (Signed)
Name: Isaiah Payne MRN: [id number] DOB: 1975-11-03    ADMISSION DATE:  08/27/2013 CONSULTATION DATE:  08/27/2013  REFERRING MD :  Rito Ehrlich PRIMARY SERVICE:  TRH  CHIEF COMPLAINT:  PE  BRIEF PATIENT DESCRIPTION: 38 y.o. [place] male with prior DVT in 2007, has had SOB x 7 - 8 months as well as recent flight from [place].  Seen in office by Dr. Sherene Sires who ordered CTA which revealed bilateral PE.  He was [facility] by [facility] to SDU and PCCM was consulted.  SIGNIFICANT EVENTS / STUDIES:  CTA 8/12 >>> bilateral saddle PE with CT evidence of right heart strain. Echo 8/13 >>> Doppler legs 8/13>>>BLE R>L DVT  LINES / TUBES: PIV  CULTURES: None  ANTIBIOTICS: None   SUBJECTIVE:  Denies chest pain, SOB.  VITAL SIGNS: Temp:  [97.7 F (36.5 C)-98.1 F (36.7 C)] 97.7 F (36.5 C) (08/14 0422) Pulse Rate:  [56-71] 56 (08/14 0422) Resp:  [16-18] 18 (08/14 0422) BP: (101-113)/(64-73) 103/64 mmHg (08/14 0422) SpO2:  [96 %-99 %] 96 % (08/14 0422)  PHYSICAL EXAMINATION: General: very pleasant young male, resting in bed, in NAD. Neuro: A&O x 3, non-focal.  HEENT: /AT. PERRL, sclerae anicteric. Cardiovascular: RRR, no M/R/G. , no rt heeve Lungs: Respirations even and unlabored on RA.  CTA bilaterally, No W/R/R.  Abdomen: BS x 4, soft, NT/ND.  Musculoskeletal: No gross deformities, no edema.     Recent Labs Lab 08/26/13 1258 08/27/13 1608 08/28/13 0315  NA 138 140 140  K 4.5 4.3 4.1  CL 104 102 106  CO2 24 24 22   BUN 9 9 11   CREATININE 1.0 0.90 0.82  GLUCOSE 81 82 100*    Recent Labs Lab 08/27/13 1608 08/28/13 0315 08/29/13 0512  HGB 14.4 13.7 14.5  HCT 41.8 40.8 43.0  WBC 6.9 5.7 4.9  PLT 139* 138* 139*   Ct Angio Chest W/cm &/or Wo Cm  08/27/2013   CLINICAL DATA:  Shortness of breath with exertion for 7-8 months, history DVT, question pulmonary embolism  EXAM: CT ANGIOGRAPHY CHEST WITH CONTRAST  TECHNIQUE: Multidetector CT imaging of the chest was performed using  the standard protocol during bolus administration of intravenous contrast. Multiplanar CT image reconstructions and MIPs were obtained to evaluate the vascular anatomy.  CONTRAST:  80mL OMNIPAQUE IOHEXOL 350 MG/ML SOLN  COMPARISON:  CT chest 04/30/2013  FINDINGS: Aorta normal caliber without aneurysm or dissection.  Large BILATERAL pulmonary emboli identified.  Saddle embolus at bifurcation of RIGHT main pulmonary artery extending into RIGHT upper lobe, RIGHT lower lobe and RIGHT middle lobe.  Large saddle embolus at bifurcation of LEFT pulmonary artery extending into LEFT upper and LEFT lower lobes.  Associated increased RV/LV ratio of 1.36 indicative of RIGHT heart strain and at least submassive pulmonary embolism.  Normal-size lymph nodes at the hila bilaterally.  Visualized portion of upper abdomen normal appearance.  Minimal pericardial fluid inferiorly.  Lungs clear.  No pleural effusion or pneumothorax.  Osseous structures unremarkable.  Review of the MIP images confirms the above findings.  IMPRESSION: BILATERAL pulmonary emboli including saddle emboli at the bifurcations of the LEFT and RIGHT pulmonary arteries extending into all lobes.  CT evidence of right heartstrain (RV/LV Ratio = 1.36) consistent with at least submassive (intermediate risk) PE.  The presence of right heart strain has been associated with an increased risk of morbidity and mortality.  Critical Value/emergent results were called by telephone at the time of interpretation on 08/27/2013 at 2:27 pm to Dr. Sandrea Hughs ,  who verbally acknowledged these results.   Electronically Signed   By: Ulyses SouthwardMark  Boles M.D.   On: 08/27/2013 14:32   Dg Chest Port 1 View  08/28/2013   CLINICAL DATA:  Evaluate for atelectasis or infarct. Bilateral pulmonary emboli.  EXAM: PORTABLE CHEST - 1 VIEW  COMPARISON:  08/27/2013 CTA  FINDINGS: Midline trachea. Borderline cardiomegaly. Insert pleural Clear lungs.  IMPRESSION: Borderline cardiomegaly, without acute  disease.   Electronically Signed   By: Jeronimo GreavesKyle  Talbot M.D.   On: 08/28/2013 08:10   LUPUS ANTI-COAGULANT POSITIVE  ASSESSMENT / PLAN:  Bilateral PE - CT evidence of right heart strain Prot S deficiency per patient BLE DVT  LUPUS ANTI-COAGULANT POSITIVE Plan: - Continue systemic heparin. - Bridge with either Coumadin or other NOAC.-- Pt would prefer novel agent (was on Coumadin for DVT in 2007 and did not like the hassle of blood monitoring etc).  - Will need lifelong anticoagulation. - Goal INR 2.5-3 - F/u Echo -- with evidence of acute strain (flat septum). - F/u on hypercoagulable panel: factor V leiden, lupus anticoagulant, prothrombin gene mutation, homocysteine, antithrombin III, protein C, protein S.--> please note with clot burden and heparin prot s, c , at3 NOT accurate, will need repeat on oral agent as outpt in 6 weeks - O2 support, avoid sats less 93% as Pa pressures will rise - Spoke with the patient extensively, he requested to speak with IR prior to decision on catheter directed lysis, Dr. Fredia SorrowYamagata contacted and will see patient. - If catheter directed lysis will need to be moved to the ICU.  *Care during the described time interval was provided by me and/or other providers on the critical care team. I have reviewed this patient's available data, including medical history, events of note, physical examination and test results as part of my evaluation.  Alyson ReedyWesam G. Jakorian Marengo, M.D. [facility]. Pager: [phone]. After hours pager: [phone].

## 2013-08-29 NOTE — Plan of Care (Signed)
Problem: Phase I Progression Outcomes Goal: Other Phase I Outcomes/Goals Outcome: Completed/Met Date Met:  08/29/13 Handout given on thrombolysis from Web MD site

## 2013-08-29 NOTE — Progress Notes (Signed)
Patient ID: Isaiah Payne, male   DOB: 01-01-1976, 38 y.o.   MRN: [id number]  Rediscussed ultrasound assisted PE lysis with patient this morning.  Given thrombus burden, evidence of right heart strain and evidence of pulmonary hypertension by echo, patient would benefit from catheter thrombolytic therapy to decrease long-term morbidity from right heart strain and pulmonary hypertension.  Patient agreeable to proceed with bilateral USAT today.  Will transfer to 55M ICU during infusion.

## 2013-08-29 NOTE — Progress Notes (Signed)
ANTICOAGULATION CONSULT NOTE - Follow Up Consult  Pharmacy Consult for heparin  Indication: saddle PE   No Known Allergies  Patient Measurements: Height: 6' (182.9 cm) Weight: 175 lb 7.8 oz (79.6 kg) IBW/kg (Calculated) : 77.6 Heparin Dosing Weight: 79  Vital Signs: Temp: 97.7 F (36.5 C) (08/14 0422) BP: 103/64 mmHg (08/14 0422) Pulse Rate: 56 (08/14 0422)  Labs:  Recent Labs  08/26/13 1258 08/27/13 1608  08/28/13 0315 08/28/13 0805 08/28/13 1700 08/29/13 0512  HGB 15.2 14.4  --  13.7  --   --  14.5  HCT 45.9 41.8  --  40.8  --   --  43.0  PLT 153.0 139*  --  138*  --   --  139*  HEPARINUNFRC  --   --   < >  --  1.02* 0.42 0.33  CREATININE 1.0 0.90  --  0.82  --   --   --   TROPONINI  --  <0.30  --  <0.30  --   --   --   < > = values in this interval not displayed.  Estimated Creatinine Clearance: 134.1 ml/min (by C-G formula based on Cr of 0.82).  Assessment: [age] YOM with saddle PE and RH strain after long flight to [place]. Targeting upper end of goal due to clot burden and RH strain. H/H WNL, plts slightly low at 139. no AC PTA (completed course of coumadin for hx DVT). HL 0.33 on 1250 units/hr - pt with large clot burden - would like level 0.5-0.7. No bleeding noted. For cath directed thrombolysis in IR today.  Goal of Therapy:  Heparin level 0.3-0.7 units/ml Monitor platelets by anticoagulation protocol: Yes   Plan:  Increase heparin to 1350 units/hr F/u post cath directed thrombolysis in IR today  Christoper Fabianaron Vaudine Dutan, PharmD, BCPS Clinical pharmacist, pager [phone] 08/29/2013,10:15 AM

## 2013-08-29 NOTE — Progress Notes (Signed)
Patient Demographics  Isaiah Payne, is a 38 y.o. male, DOB - 02-15-1975, WGN:[id number]  Admit date - 08/27/2013   Admitting Physician Osvaldo ShipperGokul Krishnan, MD  Outpatient Primary MD for the patient is Darrow BussingKOIRALA,DIBAS, MD  LOS - 2   Chief Complaint  Patient presents with  . Shortness of Breath        Subjective:   Isaiah Payne today has, No headache, No chest pain, No abdominal pain - No Nausea, No new weakness tingling or numbness, No Cough - SOB at rest.  Assessment & Plan    1. Acute massive pulmonary embolism with bilateral lower extremity DVT in a patient with ? history of protein S deficiency diagnosed in [place], recent long travel in the airplane to [place]. 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   Currently medically stable, pulmonary critical care following, on heparin drip, now IR to do TPA per PCCM, Echo does show R sided strain, PA Pressures of 53mm of hg, ? Small chronic PEs with 6mth H/O SOB, will transition to xaralto in the morning. Case management requested to evaluate the patient.  Discussed his case with oncologist on call Dr.Sehbai who agrees with the xaralto.  ER anticoagulation panel pending. ? Lupus anticoagulant +ve, will call formal Oncology consult.     2. Per patient he was diagnosed with pulmonary hypertension in [place]. TTE noted, he is set up to see Dr. Sherene SiresWert pulmonary post DC, question if he's been getting small PEs intermittently due to his underlying hypercoagulable state. Will ambulate and evaluate for any oxygen need. Will repeat TTE post TPA.    3.GERD - continue PPI.    4. Mild thrombocytopenia secondary to consumption from massive clot burden. No acute issue monitor.    5. T wave changes on EKG.  Chest pain-free, likely strain pattern due to massive PE and clot burden, troponin 2 sets negative. Echo noted.      Code Status: Full  Family Communication: None present  Disposition Plan: Home   Procedures CT angiogram chest, lower estimate the venous duplex, transthoracic echo gram  TTE - Left ventricle: The cavity size was normal. There was moderate concentric hypertrophy. Systolic function was normal. The estimated ejection fraction was in the range of 50% to 55%. Wall motion was normal; there were no regional wall motion abnormalities. - Ventricular septum: Septal motion showed abnormal function and dyssynergy. The contour showed systolic flattening. These changes are consistent with RV pressure overload. - Right ventricle: The cavity size was dilated. Wall thickness was normal. Systolic function was reduced. - Right atrium: The atrium was moderately dilated. - Pulmonary arteries: Systolic pressure was moderately increased. PA peak pressure: 53 mm Hg (S).    Consults  pulmonary care, discussed with oncologist Dr Gerrit HallsSehbai on call over the phone on 08/28/2013 okay for xaralto, will call formal consult as well as ? Lupus anticoagulant +ve.   Medications  Scheduled Meds: . famotidine  20 mg Oral QHS  . pantoprazole  40 mg Oral QAC breakfast  . sodium chloride  3 mL Intravenous Q12H   Continuous Infusions: . heparin 1,350 Units/hr (08/29/13 0822)   PRN Meds:.acetaminophen, albuterol, morphine injection, ondansetron (ZOFRAN) IV  DVT Prophylaxis  heparin drip  Lab Results  Component Value Date   PLT 139* 08/29/2013    Antibiotics    Anti-infectives   None          Objective:   Filed Vitals:   08/28/13 1327 08/28/13 1638 08/28/13 2042 08/29/13 0422  BP: 112/73 101/67 113/73 103/64  Pulse: 71 66 64 56  Temp: 98.1 F (36.7 C) 98 F (36.7 C) 97.8 F (36.6 C) 97.7 F (36.5 C)  TempSrc: Oral Oral Oral   Resp: 16 18 18 18   Height:      Weight:      SpO2:  99% 97% 99% 96%    Wt Readings from Last 3 Encounters:  08/27/13 79.6 kg (175 lb 7.8 oz)  08/26/13 80.74 kg (178 lb)  07/12/06 81.693 kg (180 lb 1.6 oz)     Intake/Output Summary (Last 24 hours) at 08/29/13 1059 Last data filed at 08/28/13 1650  Gross per 24 hour  Intake    440 ml  Output      0 ml  Net    440 ml     Physical Exam  Awake Alert, Oriented X 3, No new F.N deficits, Normal affect [place].AT,PERRAL Supple Neck,No JVD, No cervical lymphadenopathy appriciated.  Symmetrical Chest wall movement, Good air movement bilaterally, CTAB RRR,No Gallops,Rubs or new Murmurs, No Parasternal Heave +ve B.Sounds, Abd Soft, No tenderness, No organomegaly appriciated, No rebound - guarding or rigidity. No Cyanosis, Clubbing or edema, No new Rash or bruise    Data Review   Micro Results Recent Results (from the past 240 hour(s))  MRSA PCR SCREENING     Status: None   Collection Time    08/27/13  7:06 PM      Result Value Ref Range Status   MRSA by PCR NEGATIVE  NEGATIVE Final   Comment:            The GeneXpert MRSA Assay (FDA     approved for NASAL specimens     only), is one component of a     comprehensive MRSA colonization     surveillance program. It is not     intended to diagnose MRSA     infection nor to guide or     monitor treatment for     MRSA infections.    Radiology Reports Ct Angio Chest W/cm &/or Wo Cm  08/27/2013   CLINICAL DATA:  Shortness of breath with exertion for 7-8 months, history DVT, question pulmonary embolism  EXAM: CT ANGIOGRAPHY CHEST WITH CONTRAST  TECHNIQUE: Multidetector CT imaging of the chest was performed using the standard protocol during bolus administration of intravenous contrast. Multiplanar CT image reconstructions and MIPs were obtained to evaluate the vascular anatomy.  CONTRAST:  80mL OMNIPAQUE IOHEXOL 350 MG/ML SOLN  COMPARISON:  CT chest 04/30/2013  FINDINGS: Aorta normal caliber without aneurysm or dissection.  Large BILATERAL  pulmonary emboli identified.  Saddle embolus at bifurcation of RIGHT main pulmonary artery extending into RIGHT upper lobe, RIGHT lower lobe and RIGHT middle lobe.  Large saddle embolus at bifurcation of LEFT pulmonary artery extending into LEFT upper and LEFT lower lobes.  Associated increased RV/LV ratio of 1.36 indicative of RIGHT heart strain and at least submassive pulmonary embolism.  Normal-size lymph nodes at the hila bilaterally.  Visualized portion of upper abdomen normal appearance.  Minimal pericardial fluid inferiorly.  Lungs clear.  No pleural effusion or pneumothorax.  Osseous structures unremarkable.  Review of the MIP images confirms the above findings.  IMPRESSION: BILATERAL pulmonary emboli including saddle emboli at the bifurcations of the LEFT and RIGHT pulmonary arteries extending into all lobes.  CT evidence of right heartstrain (RV/LV Ratio = 1.36) consistent with at least submassive (intermediate risk) PE.  The presence of right heart strain has been associated with an increased risk of morbidity and mortality.  Critical Value/emergent results were called by telephone at the time of interpretation on 08/27/2013 at 2:27 pm to Dr. Sandrea Hughs , who verbally acknowledged these results.   Electronically Signed   By: Ulyses Southward M.D.   On: 08/27/2013 14:32   Dg Chest Port 1 View  08/28/2013   CLINICAL DATA:  Evaluate for atelectasis or infarct. Bilateral pulmonary emboli.  EXAM: PORTABLE CHEST - 1 VIEW  COMPARISON:  08/27/2013 CTA  FINDINGS: Midline trachea. Borderline cardiomegaly. Insert pleural Clear lungs.  IMPRESSION: Borderline cardiomegaly, without acute disease.   Electronically Signed   By: Jeronimo Greaves M.D.   On: 08/28/2013 08:10    CBC  Recent Labs Lab 08/26/13 1258 08/27/13 1608 08/28/13 0315 08/29/13 0512  WBC 7.1 6.9 5.7 4.9  HGB 15.2 14.4 13.7 14.5  HCT 45.9 41.8 40.8 43.0  PLT 153.0 139* 138* 139*  MCV 82.9 81.3 80.5 82.5  MCH  --  28.0 27.0 27.8  MCHC 33.1  34.4 33.6 33.7  RDW 15.0 14.2 14.1 14.2  LYMPHSABS 2.8 2.5  --   --   MONOABS 0.6 0.6  --   --   EOSABS 0.3 0.3  --   --   BASOSABS 0.0 0.1  --   --     Chemistries   Recent Labs Lab 08/26/13 1258 08/27/13 1608 08/28/13 0315  NA 138 140 140  K 4.5 4.3 4.1  CL 104 102 106  CO2 24 24 22   GLUCOSE 81 82 100*  BUN 9 9 11   CREATININE 1.0 0.90 0.82  CALCIUM 9.5 9.2 8.7  AST  --   --  23  ALT  --   --  32  ALKPHOS  --   --  88  BILITOT  --   --  1.6*   ------------------------------------------------------------------------------------------------------------------ estimated creatinine clearance is 134.1 ml/min (by C-G formula based on Cr of 0.82). ------------------------------------------------------------------------------------------------------------------ No results found for this basename: HGBA1C,  in the last 72 hours ------------------------------------------------------------------------------------------------------------------ No results found for this basename: CHOL, HDL, LDLCALC, TRIG, CHOLHDL, LDLDIRECT,  in the last 72 hours ------------------------------------------------------------------------------------------------------------------  Recent Labs  08/26/13 1258  TSH 1.32   ------------------------------------------------------------------------------------------------------------------ No results found for this basename: VITAMINB12, FOLATE, FERRITIN, TIBC, IRON, RETICCTPCT,  in the last 72 hours  Coagulation profile No results found for this basename: INR, PROTIME,  in the last 168 hours   Recent Labs  08/26/13 1258  DDIMER 5.45*    Cardiac Enzymes  Recent Labs Lab 08/27/13 1608 08/28/13 0315  TROPONINI <0.30 <0.30   ------------------------------------------------------------------------------------------------------------------ No components found with this basename: POCBNP,      Time Spent in minutes   35   Susa Raring K M.D on  08/29/2013 at 10:59 AM  Between 7am to 7pm - Pager - [phone]  After 7pm go to [email or web address] - password [email or web address]  And look for the night coverage person covering for me after hours  [facility] Office  [phone]   **Disclaimer: This note may have been dictated with voice recognition software. Similar sounding words can inadvertently be transcribed and this note may contain transcription errors which may not have been corrected upon publication of note.**

## 2013-08-29 NOTE — Progress Notes (Addendum)
Pharmacy Consult - Heparin  S/p EKOs procedure for bilateral PE- to follow heparin levels q 6 hr x 24 hours 1600 HL = 0.5 2200 HL = 0.62  Plan: Continue heparin at 1350 units / hr Next heparin level at 0400  Thank you. Okey RegalLisa Odeth Bry PharmD [phone]

## 2013-08-29 NOTE — Care Management Note (Addendum)
    Page 1 of 1   08/30/2013     3:56:34 PM CARE MANAGEMENT NOTE 08/30/2013  Patient:  Isaiah Payne, Isaiah Payne   Account Number:  [id number]  Date Initiated:  08/29/2013  Documentation initiated by:  Babette Relic  Subjective/Objective Assessment:   dx PE; lives with family    PCP  Dibas Koirala     Action/Plan:   Anticipated DC Date:  08/29/2013   Anticipated DC Plan:  [place]  CM consult      Choice offered to / List presented to:             Status of service:  Completed, signed off Medicare Important Message given?  NO (If response is "NO", the following Medicare IM given date fields will be blank) Date Medicare IM given:   Medicare IM given by:   Date Additional Medicare IM given:   Additional Medicare IM given by:    Discharge Disposition:  HOME/SELF CARE  Per UR Regulation:  Reviewed for med. necessity/level of care/duration of stay  If discussed at [place] of Stay Meetings, dates discussed:    Comments:  08/30/13 CM gave pt free 30 day trial card which is already activated.  He now has free 30 days, free one year and co-pay card for xarelto.  No other CM needs were communicated.  Mariane Masters, BSN, [place].  08/29/13 [place], BSN [phone] NCM will give patient 12 months free savings card fro xarelto and also co pay savings card.  08/29/13 [place] MSN BSN CCM Per CMA:  PT HAS A HIGH DEDUCTIBLE OF $10,000 AND PT HAS ONLY MET $5. ONCE PT HAS MET DEDUCTIBLE, MED WIL BE COVERED AT 100%

## 2013-08-29 NOTE — Procedures (Signed)
Procedure:  Bilateral pulmonary arteriography with ultrasound assisted thrombolysis (USAT) to treat submassive PE with right heart strain. Findings:  PA pressures elevated and identical in bilateral pulmonary arteries, 50/21 mmHg with mean PAP of 33 mm Hg.  Significant nonocclusive thrombus in left PA and LLL PA and occlusive thrombus in RLL PA. 18 cm infusion length right PA EKOS catheter and 12 cm infusion length left PA EKOS catheter placed. USAT begun with dose of 1 mg/hr tPA infusing for 12 total hours.  Will switch to saline infusion after 12 hours and recheck PA pressures tomorrow morning with removal of catheters at that time.

## 2013-08-30 ENCOUNTER — Inpatient Hospital Stay (HOSPITAL_COMMUNITY): Payer: BC Managed Care – PPO

## 2013-08-30 LAB — CBC
HCT: 39.5 % (ref 39.0–52.0)
HCT: 41.5 % (ref 39.0–52.0)
Hemoglobin: 13.2 g/dL (ref 13.0–17.0)
Hemoglobin: 13.8 g/dL (ref 13.0–17.0)
MCH: 27 pg (ref 26.0–34.0)
MCH: 27.4 pg (ref 26.0–34.0)
MCHC: 33.4 g/dL (ref 30.0–36.0)
MCHC: 33.3 g/dL (ref 30.0–36.0)
MCV: 80.9 fL (ref 78.0–100.0)
MCV: 82.3 fL (ref 78.0–100.0)
Platelets: 124 10*3/uL — ABNORMAL LOW (ref 150–400)
Platelets: 120 10*3/uL — ABNORMAL LOW (ref 150–400)
RBC: 4.88 MIL/uL (ref 4.22–5.81)
RBC: 5.04 MIL/uL (ref 4.22–5.81)
RDW: 14.1 % (ref 11.5–15.5)
RDW: 14.1 % (ref 11.5–15.5)
WBC: 5 10*3/uL (ref 4.0–10.5)
WBC: 4.1 10*3/uL (ref 4.0–10.5)

## 2013-08-30 LAB — BASIC METABOLIC PANEL
Anion gap: 12 (ref 5–15)
BUN: 8 mg/dL (ref 6–23)
CO2: 22 mEq/L (ref 19–32)
Calcium: 8.9 mg/dL (ref 8.4–10.5)
Chloride: 104 mEq/L (ref 96–112)
Creatinine, Ser: 0.84 mg/dL (ref 0.50–1.35)
GFR calc Af Amer: >90 mL/min (ref >90)
GFR calc non Af Amer: >90 mL/min (ref >90)
Glucose, Bld: 90 mg/dL (ref 70–99)
Potassium: 4.2 mEq/L (ref 3.7–5.3)
Sodium: 138 mEq/L (ref 137–147)

## 2013-08-30 LAB — FIBRINOGEN
Fibrinogen: 303 mg/dL (ref 204–475)
Fibrinogen: 324 mg/dL (ref 204–475)

## 2013-08-30 LAB — HEPARIN LEVEL (UNFRACTIONATED)
Heparin Unfractionated: 0.73 IU/mL — ABNORMAL HIGH (ref 0.30–0.70)
Heparin Unfractionated: 0.23 IU/mL — ABNORMAL LOW (ref 0.30–0.70)
Heparin Unfractionated: 0.45 IU/mL (ref 0.30–0.70)

## 2013-08-30 LAB — PROTHROMBIN GENE MUTATION

## 2013-08-30 LAB — FACTOR 5 LEIDEN

## 2013-08-30 MED ORDER — HEPARIN (PORCINE) IN NACL 100-0.45 UNIT/ML-% IJ SOLN
1350.0000 [IU]/h | INTRAMUSCULAR | Status: DC
  Administered 2013-08-30 – 2013-08-31 (×2): 1350 [IU]/h via INTRAVENOUS
  Filled 2013-08-30 (×2): qty 250

## 2013-08-30 NOTE — Progress Notes (Signed)
PHARMACY NOTE  Pharmacy Consult :  38 y.o. male is currently on Heparin infusion for Saddle PE.Marland Kitchen.   Dosing Wt :  79 kg   Hematology :  Recent Labs  08/28/13 0315  08/29/13 0512 08/29/13 1615 08/29/13 2148 08/30/13 0351 08/30/13 0352 08/30/13 1042 08/30/13 1047 08/30/13 2030  HGB 13.7  --  14.5 14.2 13.4  --  13.2 13.8  --   --   HCT 40.8  --  43.0 41.8 39.7  --  39.5 41.5  --   --   PLT 138*  --  139* 132* 129*  --  124* 120*  --   --   HEPARINUNFRC  --   < > 0.33 0.50 0.62 0.73*  --   --  0.23* 0.45  CREATININE 0.82  --   --   --   --   --  0.84  --   --   --   < > = values in this interval not displayed.  Current Medication[s] Include: Infusion[s]: Infusions:  . heparin 1,350 Units/hr (08/30/13 2104)   Assessment :  Heparin infusing at 1350 units/hr  Heparin level 0.45 units/ml.  This is within the therapeutic window..  No evidence of bleeding complications observed.  Goal :   Heparin level 0.3-0.7 units/ml.  Plan : 1. Continue Heparin at 1350 units/hr. 2. Continue Daily Heparin level, CBC while on Heparin.  Monitor for bleeding complications. Follow Platelet counts.  Doristine Shehan, Elisha HeadlandEarle J,  Pharm.D  08/30/2013  9:26 PM

## 2013-08-30 NOTE — Progress Notes (Signed)
ANTICOAGULATION CONSULT NOTE - Follow Up Consult  Pharmacy Consult for heparin  Indication: saddle PE   No Known Allergies  Patient Measurements: Height: 6' (182.9 cm) Weight: 175 lb 7.8 oz (79.6 kg) IBW/kg (Calculated) : 77.6 Heparin Dosing Weight: 79  Vital Signs: Temp: 98.4 F (36.9 C) (08/15 0700) Temp src: Oral (08/15 0700) BP: 101/81 mmHg (08/15 0830) Pulse Rate: 59 (08/15 0930)  Labs:  Recent Labs  08/27/13 1608  08/28/13 0315  08/29/13 2148 08/30/13 0351 08/30/13 0352 08/30/13 1042 08/30/13 1047  HGB 14.4  --  13.7  < > 13.4  --  13.2 13.8  --   HCT 41.8  --  40.8  < > 39.7  --  39.5 41.5  --   PLT 139*  --  138*  < > 129*  --  124* 120*  --   HEPARINUNFRC  --   < >  --   < > 0.62 0.73*  --   --  0.23*  CREATININE 0.90  --  0.82  --   --   --  0.84  --   --   TROPONINI <0.30  --  <0.30  --   --   --   --   --   --   < > = values in this interval not displayed.  Estimated Creatinine Clearance: 130.9 ml/min (by C-G formula based on Cr of 0.84).  Assessment: [age] YOM with saddle PE and RH strain after long flight to [place]. Targeting upper end of goal due to clot burden and RH strain. H/H WNL, plts low at 120. no AC PTA (completed course of coumadin for hx DVT).   Heparin level slightly low this AM. tPA has finished. Will increase rate.   Goal of Therapy:  Heparin level 0.3-0.7 units/ml Monitor platelets by anticoagulation protocol: Yes   Plan:   Increase heparin to 1350 units/hr F/u with 6 hr level

## 2013-08-30 NOTE — Progress Notes (Signed)
Patient Demographics  Isaiah Payne, is a 38 y.o. male, DOB - [birth year], ZOX:[id number]  Admit date - 08/27/2013   Admitting Physician Osvaldo Shipper, MD  Outpatient Primary MD for the patient is Darrow Bussing, MD  LOS - 3   Chief Complaint  Patient presents with  . Shortness of Breath        Subjective:   Isaiah Payne today has, No headache, No chest pain, No abdominal pain - No Nausea, No new weakness tingling or numbness, No Cough - SOB at rest.   Assessment & Plan    1. Acute massive pulmonary embolism with bilateral lower extremity DVT in a patient with ? history of protein S deficiency diagnosed in [place] -    Currently medically stable, pulmonary critical care following, on heparin drip, post TPA by IR on 08-29-13, Echo showed R sided strain, PA Pressures of 53mm of hg, ? Small chronic PEs with H/O SOB, will transition to xaralto in the morning of 08-31-13. Will get another CT angiogram of chest on 09/01/2013 discussed with IR physician Dr.Hoss.  Anticoagulation panel pending suggesting Lupus anticoagulant +ve, oncology was consulted who agreed with future xaralto an outpatient followup with them in the office after discharge.     2. Per patient he was diagnosed with pulmonary hypertension in [place]. TTE noted, he is set up to see Dr. Sherene Sires pulmonary post DC, question if he's been getting small PEs intermittently due to his underlying hypercoagulable state. Will ambulate and evaluate for any oxygen need. Repeat CT angiogram on Monday.    3.GERD - continue PPI.    4. Mild thrombocytopenia secondary to consumption from massive clot burden. No acute issue monitor.    5. T wave changes on EKG. Chest pain-free, likely strain pattern due to massive PE and clot burden, troponin  2 sets negative. Echo noted.      Code Status: Full  Family Communication: None present  Disposition Plan: Home    Procedures CT angiogram chest, lower estimate the venous duplex, transthoracic echo gram   TTE - Left ventricle: The cavity size was normal. There was moderate concentric hypertrophy. Systolic function was normal. The estimated ejection fraction was in the range of 50% to 55%. Wall motion was normal; there were no regional wall motion abnormalities. - Ventricular septum: Septal motion showed abnormal function and dyssynergy. The contour showed systolic flattening. These changes are consistent with RV pressure overload. - Right ventricle: The cavity size was dilated. Wall thickness was normal. Systolic function was reduced. - Right atrium: The atrium was moderately dilated. - Pulmonary arteries: Systolic pressure was moderately increased. PA peak pressure: 53 mm Hg (S).   TPA by IR - 08-29-13    Consults  pulmonary care, IR, Oncology    Medications  Scheduled Meds: . famotidine  20 mg Oral QHS  . pantoprazole  40 mg Oral QAC breakfast  . sodium chloride  3 mL Intravenous Q12H  . sodium chloride  3 mL Intravenous Q12H   Continuous Infusions: . sodium chloride 10 mL/hr at 08/30/13 0248  . sodium chloride 10 mL/hr at 08/30/13 0248  . sodium chloride 20 mL/hr at 08/30/13 0247  . sodium chloride 20 mL/hr at 08/30/13 0245  . heparin 1,250 Units/hr (08/30/13 0450)  PRN Meds:.sodium chloride, acetaminophen, albuterol, morphine injection, ondansetron (ZOFRAN) IV, oxyCODONE-acetaminophen, sodium chloride  DVT Prophylaxis  heparin drip  Lab Results  Component Value Date   PLT 124* 08/30/2013    Antibiotics    Anti-infectives   None          Objective:   Filed Vitals:   08/30/13 0700 08/30/13 0715 08/30/13 0730 08/30/13 0745  BP: 98/69 102/77 105/67 104/69  Pulse: 50 51 55 49  Temp: 98.4 F (36.9 C)     TempSrc: Oral     Resp: 13 21 17 18     Height:      Weight:      SpO2: 99% 99% 99% 100%    Wt Readings from Last 3 Encounters:  08/27/13 79.6 kg (175 lb 7.8 oz)  08/26/13 80.74 kg (178 lb)  07/12/06 81.693 kg (180 lb 1.6 oz)     Intake/Output Summary (Last 24 hours) at 08/30/13 0919 Last data filed at 08/30/13 0700  Gross per 24 hour  Intake 2874.94 ml  Output   1000 ml  Net 1874.94 ml     Physical Exam  Awake Alert, Oriented X 3, No new F.N deficits, Normal affect [place].AT,PERRAL Supple Neck,No JVD, No cervical lymphadenopathy appriciated.  Symmetrical Chest wall movement, Good air movement bilaterally, CTAB RRR,No Gallops,Rubs or new Murmurs, No Parasternal Heave +ve B.Sounds, Abd Soft, No tenderness, No organomegaly appriciated, No rebound - guarding or rigidity. No Cyanosis, Clubbing or edema, No new Rash or bruise  R.Fem catheter   Data Review   Micro Results Recent Results (from the past 240 hour(s))  MRSA PCR SCREENING     Status: None   Collection Time    08/27/13  7:06 PM      Result Value Ref Range Status   MRSA by PCR NEGATIVE  NEGATIVE Final   Comment:            The GeneXpert MRSA Assay (FDA     approved for NASAL specimens     only), is one component of a     comprehensive MRSA colonization     surveillance program. It is not     intended to diagnose MRSA     infection nor to guide or     monitor treatment for     MRSA infections.    Radiology Reports Ct Angio Chest W/cm &/or Wo Cm  08/27/2013   CLINICAL DATA:  Shortness of breath with exertion for 7-8 months, history DVT, question pulmonary embolism  EXAM: CT ANGIOGRAPHY CHEST WITH CONTRAST  TECHNIQUE: Multidetector CT imaging of the chest was performed using the standard protocol during bolus administration of intravenous contrast. Multiplanar CT image reconstructions and MIPs were obtained to evaluate the vascular anatomy.  CONTRAST:  80mL OMNIPAQUE IOHEXOL 350 MG/ML SOLN  COMPARISON:  CT chest 04/30/2013  FINDINGS: Aorta normal  caliber without aneurysm or dissection.  Large BILATERAL pulmonary emboli identified.  Saddle embolus at bifurcation of RIGHT main pulmonary artery extending into RIGHT upper lobe, RIGHT lower lobe and RIGHT middle lobe.  Large saddle embolus at bifurcation of LEFT pulmonary artery extending into LEFT upper and LEFT lower lobes.  Associated increased RV/LV ratio of 1.36 indicative of RIGHT heart strain and at least submassive pulmonary embolism.  Normal-size lymph nodes at the hila bilaterally.  Visualized portion of upper abdomen normal appearance.  Minimal pericardial fluid inferiorly.  Lungs clear.  No pleural effusion or pneumothorax.  Osseous structures unremarkable.  Review of the MIP images confirms the  above findings.  IMPRESSION: BILATERAL pulmonary emboli including saddle emboli at the bifurcations of the LEFT and RIGHT pulmonary arteries extending into all lobes.  CT evidence of right heartstrain (RV/LV Ratio = 1.36) consistent with at least submassive (intermediate risk) PE.  The presence of right heart strain has been associated with an increased risk of morbidity and mortality.  Critical Value/emergent results were called by telephone at the time of interpretation on 08/27/2013 at 2:27 pm to Dr. Sandrea HughsMICHAEL WERT , who verbally acknowledged these results.   Electronically Signed   By: Ulyses SouthwardMark  Boles M.D.   On: 08/27/2013 14:32   Dg Chest Port 1 View  08/28/2013   CLINICAL DATA:  Evaluate for atelectasis or infarct. Bilateral pulmonary emboli.  EXAM: PORTABLE CHEST - 1 VIEW  COMPARISON:  08/27/2013 CTA  FINDINGS: Midline trachea. Borderline cardiomegaly. Insert pleural Clear lungs.  IMPRESSION: Borderline cardiomegaly, without acute disease.   Electronically Signed   By: Jeronimo GreavesKyle  Talbot M.D.   On: 08/28/2013 08:10    CBC  Recent Labs Lab 08/26/13 1258  08/27/13 1608 08/28/13 0315 08/29/13 0512 08/29/13 1615 08/29/13 2148 08/30/13 0352  WBC 7.1  --  6.9 5.7 4.9 6.0 4.7 5.0  HGB 15.2  --  14.4 13.7  14.5 14.2 13.4 13.2  HCT 45.9  --  41.8 40.8 43.0 41.8 39.7 39.5  PLT 153.0  --  139* 138* 139* 132* 129* 124*  MCV 82.9  --  81.3 80.5 82.5 81.0 81.2 80.9  MCH  --   < > 28.0 27.0 27.8 27.5 27.4 27.0  MCHC 33.1  --  34.4 33.6 33.7 34.0 33.8 33.4  RDW 15.0  --  14.2 14.1 14.2 14.2 14.1 14.1  LYMPHSABS 2.8  --  2.5  --   --   --   --   --   MONOABS 0.6  --  0.6  --   --   --   --   --   EOSABS 0.3  --  0.3  --   --   --   --   --   BASOSABS 0.0  --  0.1  --   --   --   --   --   < > = values in this interval not displayed.  Chemistries   Recent Labs Lab 08/26/13 1258 08/27/13 1608 08/28/13 0315 08/30/13 0352  NA 138 140 140 138  K 4.5 4.3 4.1 4.2  CL 104 102 106 104  CO2 24 24 22 22   GLUCOSE 81 82 100* 90  BUN 9 9 11 8   CREATININE 1.0 0.90 0.82 0.84  CALCIUM 9.5 9.2 8.7 8.9  AST  --   --  23  --   ALT  --   --  32  --   ALKPHOS  --   --  88  --   BILITOT  --   --  1.6*  --    ------------------------------------------------------------------------------------------------------------------ estimated creatinine clearance is 130.9 ml/min (by C-G formula based on Cr of 0.84). ------------------------------------------------------------------------------------------------------------------ No results found for this basename: HGBA1C,  in the last 72 hours ------------------------------------------------------------------------------------------------------------------ No results found for this basename: CHOL, HDL, LDLCALC, TRIG, CHOLHDL, LDLDIRECT,  in the last 72 hours ------------------------------------------------------------------------------------------------------------------ No results found for this basename: TSH, T4TOTAL, FREET3, T3FREE, THYROIDAB,  in the last 72 hours ------------------------------------------------------------------------------------------------------------------ No results found for this basename: VITAMINB12, FOLATE, FERRITIN, TIBC, IRON, RETICCTPCT,   in the last 72 hours  Coagulation profile No results found for this basename: INR,  PROTIME,  in the last 168 hours  No results found for this basename: DDIMER,  in the last 72 hours  Cardiac Enzymes  Recent Labs Lab 08/27/13 1608 08/28/13 0315  TROPONINI <0.30 <0.30   ------------------------------------------------------------------------------------------------------------------ No components found with this basename: POCBNP,      Time Spent in minutes   35   Susa Raring K M.D on 08/30/2013 at 9:19 AM  Between 7am to 7pm - Pager - [phone]  After 7pm go to [email or web address] - password [email or web address]  And look for the night coverage person covering for me after hours  [facility] Office  [phone]   **Disclaimer: This note may have been dictated with voice recognition software. Similar sounding words can inadvertently be transcribed and this note may contain transcription errors which may not have been corrected upon publication of note.**

## 2013-08-30 NOTE — Procedures (Signed)
PE lysis check FU MAP 20 mm HG No comp

## 2013-08-30 NOTE — Progress Notes (Signed)
Right pulmonary pressure 31/12 mean of 19. Left pulmonary pressure 32/10 mean of 19. Pressure were taken at 0900.

## 2013-08-30 NOTE — Progress Notes (Signed)
ANTICOAGULATION CONSULT NOTE - Follow Up Consult  Pharmacy Consult for heparin  Indication: saddle PE   No Known Allergies  Patient Measurements: Height: 6' (182.9 cm) Weight: 175 lb 7.8 oz (79.6 kg) IBW/kg (Calculated) : 77.6 Heparin Dosing Weight: 79  Vital Signs: Temp: 97.6 F (36.4 C) (08/15 0400) Temp src: Oral (08/15 0400) BP: 102/70 mmHg (08/15 0400) Pulse Rate: 54 (08/15 0400)  Labs:  Recent Labs  08/27/13 1608  08/28/13 0315  08/29/13 1615 08/29/13 2148 08/30/13 0351 08/30/13 0352  HGB 14.4  --  13.7  < > 14.2 13.4  --  13.2  HCT 41.8  --  40.8  < > 41.8 39.7  --  39.5  PLT 139*  --  138*  < > 132* 129*  --  124*  HEPARINUNFRC  --   < >  --   < > 0.50 0.62 0.73*  --   CREATININE 0.90  --  0.82  --   --   --   --  0.84  TROPONINI <0.30  --  <0.30  --   --   --   --   --   < > = values in this interval not displayed.  Estimated Creatinine Clearance: 130.9 ml/min (by C-G formula based on Cr of 0.84).  Assessment: [age] YOM with saddle PE and RH strain after long flight to [place]. Targeting upper end of goal due to clot burden and RH strain. H/H WNL, plts slightly low at 139. no AC PTA (completed course of coumadin for hx DVT).   Heparin level slightly above goal this morning at 0.73. tPA has finished, no bleeding/hematoma issues reported by nursing. Patient to return to IR later today for sheath removal and re-examination of clot burden. Will reduce heparin rate slightly and continue to follow progress.  Goal of Therapy:  Heparin level 0.3-0.7 units/ml Monitor platelets by anticoagulation protocol: Yes   Plan:  Decrease heparin to 1250 units/hr Follow IR plan   Sheppard CoilFrank Mairen Wallenstein PharmD., BCPS Clinical Pharmacist Pager [phone] 08/30/2013 4:53 AM

## 2013-08-31 LAB — CBC
HCT: 42.9 % (ref 39.0–52.0)
Hemoglobin: 14.2 g/dL (ref 13.0–17.0)
MCH: 27.4 pg (ref 26.0–34.0)
MCHC: 33.1 g/dL (ref 30.0–36.0)
MCV: 82.8 fL (ref 78.0–100.0)
Platelets: 145 10*3/uL — ABNORMAL LOW (ref 150–400)
RBC: 5.18 MIL/uL (ref 4.22–5.81)
RDW: 14 % (ref 11.5–15.5)
WBC: 3.7 10*3/uL — ABNORMAL LOW (ref 4.0–10.5)

## 2013-08-31 LAB — HEPARIN LEVEL (UNFRACTIONATED): Heparin Unfractionated: 0.47 IU/mL (ref 0.30–0.70)

## 2013-08-31 NOTE — Progress Notes (Signed)
ANTICOAGULATION CONSULT NOTE - Follow Up Consult  Pharmacy Consult for heparin  Indication: saddle PE, bilateral DVT   No Known Allergies  Patient Measurements: Height: 6' (182.9 cm) Weight: 175 lb 7.8 oz (79.6 kg) IBW/kg (Calculated) : 77.6 Heparin Dosing Weight: 79  Vital Signs: Temp: 98.3 F (36.8 C) (08/16 1341) Temp src: Oral (08/16 1341) BP: 134/73 mmHg (08/16 1341) Pulse Rate: 76 (08/16 1341)  Labs:  Recent Labs  08/30/13 0352 08/30/13 1042 08/30/13 1047 08/30/13 2030 08/31/13 0510  HGB 13.2 13.8  --   --  14.2  HCT 39.5 41.5  --   --  42.9  PLT 124* 120*  --   --  145*  HEPARINUNFRC  --   --  0.23* 0.45 0.47  CREATININE 0.84  --   --   --   --     Estimated Creatinine Clearance: 130.9 ml/min (by C-G formula based on Cr of 0.84).  Assessment: 38 yo M with saddle PE and RH strain after long flight to [place]. Pt is s/p catheter directed thrombolysis using the EKOS system.  Heparin remains therapeutic on 1350 units/hr.  Noted plans to transition to oral anticoagulation tomorrow.    Goal of Therapy:  Heparin level 0.3-0.7 units/ml Monitor platelets by anticoagulation protocol: Yes   Plan:  Continue heparin at 1350 units/hr Continue daily heparin level and CBC   Yazlynn Birkeland, Pharm.D., BCPS Clinical Pharmacist Pager [phone] 08/31/2013 2:50 PM

## 2013-08-31 NOTE — Progress Notes (Signed)
Subjective: Pt looks and feels good this am. Denies SOB. Has been eating reg diet Has been OOB some  Objective: Physical Exam: BP 102/61  Pulse 56  Temp(Src) 97.8 F (36.6 C) (Oral)  Resp 16  Ht 6' (1.829 m)  Wt 175 lb 7.8 oz (79.6 kg)  BMI 23.79 kg/m2  SpO2 99% Lungs: CTA Heart: Reg (R)groin venipuncture site clean, no hemaotma    Labs: CBC  Recent Labs  08/30/13 1042 08/31/13 0510  WBC 4.1 3.7*  HGB 13.8 14.2  HCT 41.5 42.9  PLT 120* 145*   BMET  Recent Labs  08/30/13 0352  NA 138  K 4.2  CL 104  CO2 22  GLUCOSE 90  BUN 8  CREATININE 0.84  CALCIUM 8.9   LFT No results found for this basename: PROT, ALBUMIN, AST, ALT, ALKPHOS, BILITOT, BILIDIR, IBILI, LIPASE,  in the last 72 hours PT/INR No results found for this basename: LABPROT, INR,  in the last 72 hours   Studies/Results: Ir Angiogram Pulmonary Bilateral Selective  08/29/2013   INDICATION: Submassive bilateral pulmonary embolism with right heart strain and pulmonary hypertension by echocardiography.  EXAM: 1. ULTRASOUND GUIDANCE FOR VENOUS ACCESS X 2 2. BILATERAL PULMONARY ARTERIOGRAPHY 3. FLUOROSCOPIC GUIDED PLACEMENT OF BILATERAL PULMONARY ARTERIAL LYTIC INFUSION CATHETERS 4. ULTRASOUND ASSISTED THROMBOLYTIC THERAPY OF LEFT PULMONARY ARTERY 5. ULTRASOUND ASSISTED THROMBOLYTIC THERAPY OF RIGHT PULMONARY ARTERY  COMPARISON:  CTA OF THE CHEST, 08/27/2013  MEDICATIONS: Versed 1.0 mg IV; Fentanyl 25 mcg IV  Sedation time:  CONTRAST:  50 mL Omnipaque 300  FLUOROSCOPY TIME:  11 minutes 24 seconds.  COMPLICATIONS: None immediate  TECHNIQUE: Informed written consent was obtained from the patient after a discussion of the risks, benefits and alternatives to treatment. Questions regarding the procedure were encouraged and answered.  The right groin was prepped and draped in the usual sterile fashion, and a sterile drape was applied covering the operative field. Maximum barrier sterile technique with  sterile gowns and gloves were used for the procedure. A timeout was performed prior to the initiation of the procedure. Local anesthesia was provided with 1% lidocaine.  Under direct ultrasound guidance, the right common femoral vein was accessed with a micro puncture set ultimately allowing placement of a 6 French vascular sheath. Adjacent to this initial access, the right common femoral vein was again accessed with a micropuncture sheath ultimately allowing placement of a 6 French vascular sheath.  With the use of a guidewire, a 5 French catheter was advanced into the right main pulmonary artery and a left pulmonary arteriogram was performed with hand injection. Arteriography was also performed at the level of the left lower lobe pulmonary artery. Pressure measurements were then obtained from the left pulmonary artery.  Over an exchange length Rosen wire, the pigtail catheter was exchanged for a 105/18 cm multi side-hole EKOS ultrasound assisted infusion catheter.  With the use of a guidewire, a 5 French catheter was advanced into the right main pulmonary artery. Pulmonary artery pressures were obtained. A pulmonary arteriogram was performed with hand injection. Arteriography was also performed at the level of the right lower lobe pulmonary artery.  Over an exchange length Rosen wire, the pigtail catheter was exchanged for a 105/12 cm multi side-hole EKOS ultrasound assisted infusion catheter.  A postprocedural fluoroscopic image was obtained to confirm final catheter positioning.  Both vascular sheath were secured at the right groin with interrupted 0 Prolene sutures. The external catheter tubing was secured and thrombolytic lytic therapy was initiated.  Thrombolytic therapy was begun via the EKOS system with infusion of tPA at a dose of 1 mg/hr through each infusion catheter.  The patient tolerated the procedure well without immediate postprocedural complication.  FINDINGS: Pulmonary arteriography demonstrated  significant nonocclusive thrombus in the distal left main pulmonary artery extending into the lower lobe artery. There was nonocclusive thrombus in upper lobe branches. Right pulmonary arteriography demonstrates significant thrombus in the distal right main pulmonary artery extending into the lower lobe pulmonary artery. Poor flow was noted in lower lobe branches and there was also lack of good visualization of upper lobe supply.  Pulmonary arterial pressure measurements:  Both main pulmonary artery segments demonstrated identical pressures of 51/22 mm Hg with mean pressure of 33 mm Hg.  Following the procedure, both ultrasound assisted infusion catheter tips terminate within the distal aspects of the bilateral lower lobe sub segmental pulmonary arteries.  IMPRESSION: 1. Successful initiation of bilateral ultrasound assisted catheter directed pulmonary arterial lysis for submassive pulmonary embolism and right-sided heart strain. Bilateral EKOS thrombolytic catheters were placed and advanced into lower lobe pulmonary artery branches. Infusion of tPA was begun through each catheter at a rate of 1 milligram/hour. 2. Markedly elevated pressure measurements within the right and left pulmonary arteries compatible with critical pulmonary arterial hypertension. 3. Pulmonary arteriography confirms the presence of significant thrombus in both left and right pulmonary arteries extending into lower lobe pulmonary artery branches. Thrombus in the right lower lobe pulmonary artery is nearly occlusive.  PLAN: The patient will return to IR tomorrow morning for repeat pressure measurements and removal of the catheters. After 12 hour infusion via each catheter, infusion will be converted to saline overnight.   Electronically Signed   By: Irish Lack M.D.   On: 08/29/2013 17:24   Ir Angiogram Selective Each Additional Vessel  08/29/2013   INDICATION: Submassive bilateral pulmonary embolism with right heart strain and pulmonary  hypertension by echocardiography.  EXAM: 1. ULTRASOUND GUIDANCE FOR VENOUS ACCESS X 2 2. BILATERAL PULMONARY ARTERIOGRAPHY 3. FLUOROSCOPIC GUIDED PLACEMENT OF BILATERAL PULMONARY ARTERIAL LYTIC INFUSION CATHETERS 4. ULTRASOUND ASSISTED THROMBOLYTIC THERAPY OF LEFT PULMONARY ARTERY 5. ULTRASOUND ASSISTED THROMBOLYTIC THERAPY OF RIGHT PULMONARY ARTERY  COMPARISON:  CTA OF THE CHEST, 08/27/2013  MEDICATIONS: Versed 1.0 mg IV; Fentanyl 25 mcg IV  Sedation time:  CONTRAST:  50 mL Omnipaque 300  FLUOROSCOPY TIME:  11 minutes 24 seconds.  COMPLICATIONS: None immediate  TECHNIQUE: Informed written consent was obtained from the patient after a discussion of the risks, benefits and alternatives to treatment. Questions regarding the procedure were encouraged and answered.  The right groin was prepped and draped in the usual sterile fashion, and a sterile drape was applied covering the operative field. Maximum barrier sterile technique with sterile gowns and gloves were used for the procedure. A timeout was performed prior to the initiation of the procedure. Local anesthesia was provided with 1% lidocaine.  Under direct ultrasound guidance, the right common femoral vein was accessed with a micro puncture set ultimately allowing placement of a 6 French vascular sheath. Adjacent to this initial access, the right common femoral vein was again accessed with a micropuncture sheath ultimately allowing placement of a 6 French vascular sheath.  With the use of a guidewire, a 5 French catheter was advanced into the right main pulmonary artery and a left pulmonary arteriogram was performed with hand injection. Arteriography was also performed at the level of the left lower lobe pulmonary artery. Pressure measurements were then obtained from the  left pulmonary artery.  Over an exchange length Rosen wire, the pigtail catheter was exchanged for a 105/18 cm multi side-hole EKOS ultrasound assisted infusion catheter.  With the use  of a guidewire, a 5 French catheter was advanced into the right main pulmonary artery. Pulmonary artery pressures were obtained. A pulmonary arteriogram was performed with hand injection. Arteriography was also performed at the level of the right lower lobe pulmonary artery.  Over an exchange length Rosen wire, the pigtail catheter was exchanged for a 105/12 cm multi side-hole EKOS ultrasound assisted infusion catheter.  A postprocedural fluoroscopic image was obtained to confirm final catheter positioning.  Both vascular sheath were secured at the right groin with interrupted 0 Prolene sutures. The external catheter tubing was secured and thrombolytic lytic therapy was initiated. Thrombolytic therapy was begun via the EKOS system with infusion of tPA at a dose of 1 mg/hr through each infusion catheter.  The patient tolerated the procedure well without immediate postprocedural complication.  FINDINGS: Pulmonary arteriography demonstrated significant nonocclusive thrombus in the distal left main pulmonary artery extending into the lower lobe artery. There was nonocclusive thrombus in upper lobe branches. Right pulmonary arteriography demonstrates significant thrombus in the distal right main pulmonary artery extending into the lower lobe pulmonary artery. Poor flow was noted in lower lobe branches and there was also lack of good visualization of upper lobe supply.  Pulmonary arterial pressure measurements:  Both main pulmonary artery segments demonstrated identical pressures of 51/22 mm Hg with mean pressure of 33 mm Hg.  Following the procedure, both ultrasound assisted infusion catheter tips terminate within the distal aspects of the bilateral lower lobe sub segmental pulmonary arteries.  IMPRESSION: 1. Successful initiation of bilateral ultrasound assisted catheter directed pulmonary arterial lysis for submassive pulmonary embolism and right-sided heart strain. Bilateral EKOS thrombolytic catheters were placed and  advanced into lower lobe pulmonary artery branches. Infusion of tPA was begun through each catheter at a rate of 1 milligram/hour. 2. Markedly elevated pressure measurements within the right and left pulmonary arteries compatible with critical pulmonary arterial hypertension. 3. Pulmonary arteriography confirms the presence of significant thrombus in both left and right pulmonary arteries extending into lower lobe pulmonary artery branches. Thrombus in the right lower lobe pulmonary artery is nearly occlusive.  PLAN: The patient will return to IR tomorrow morning for repeat pressure measurements and removal of the catheters. After 12 hour infusion via each catheter, infusion will be converted to saline overnight.   Electronically Signed   By: Irish Lack M.D.   On: 08/29/2013 17:24   Ir Angiogram Selective Each Additional Vessel  08/29/2013   INDICATION: Submassive bilateral pulmonary embolism with right heart strain and pulmonary hypertension by echocardiography.  EXAM: 1. ULTRASOUND GUIDANCE FOR VENOUS ACCESS X 2 2. BILATERAL PULMONARY ARTERIOGRAPHY 3. FLUOROSCOPIC GUIDED PLACEMENT OF BILATERAL PULMONARY ARTERIAL LYTIC INFUSION CATHETERS 4. ULTRASOUND ASSISTED THROMBOLYTIC THERAPY OF LEFT PULMONARY ARTERY 5. ULTRASOUND ASSISTED THROMBOLYTIC THERAPY OF RIGHT PULMONARY ARTERY  COMPARISON:  CTA OF THE CHEST, 08/27/2013  MEDICATIONS: Versed 1.0 mg IV; Fentanyl 25 mcg IV  Sedation time:  CONTRAST:  50 mL Omnipaque 300  FLUOROSCOPY TIME:  11 minutes 24 seconds.  COMPLICATIONS: None immediate  TECHNIQUE: Informed written consent was obtained from the patient after a discussion of the risks, benefits and alternatives to treatment. Questions regarding the procedure were encouraged and answered.  The right groin was prepped and draped in the usual sterile fashion, and a sterile drape was applied covering the operative field.  Maximum barrier sterile technique with sterile gowns and gloves were used for the  procedure. A timeout was performed prior to the initiation of the procedure. Local anesthesia was provided with 1% lidocaine.  Under direct ultrasound guidance, the right common femoral vein was accessed with a micro puncture set ultimately allowing placement of a 6 French vascular sheath. Adjacent to this initial access, the right common femoral vein was again accessed with a micropuncture sheath ultimately allowing placement of a 6 French vascular sheath.  With the use of a guidewire, a 5 French catheter was advanced into the right main pulmonary artery and a left pulmonary arteriogram was performed with hand injection. Arteriography was also performed at the level of the left lower lobe pulmonary artery. Pressure measurements were then obtained from the left pulmonary artery.  Over an exchange length Rosen wire, the pigtail catheter was exchanged for a 105/18 cm multi side-hole EKOS ultrasound assisted infusion catheter.  With the use of a guidewire, a 5 French catheter was advanced into the right main pulmonary artery. Pulmonary artery pressures were obtained. A pulmonary arteriogram was performed with hand injection. Arteriography was also performed at the level of the right lower lobe pulmonary artery.  Over an exchange length Rosen wire, the pigtail catheter was exchanged for a 105/12 cm multi side-hole EKOS ultrasound assisted infusion catheter.  A postprocedural fluoroscopic image was obtained to confirm final catheter positioning.  Both vascular sheath were secured at the right groin with interrupted 0 Prolene sutures. The external catheter tubing was secured and thrombolytic lytic therapy was initiated. Thrombolytic therapy was begun via the EKOS system with infusion of tPA at a dose of 1 mg/hr through each infusion catheter.  The patient tolerated the procedure well without immediate postprocedural complication.  FINDINGS: Pulmonary arteriography demonstrated significant nonocclusive thrombus in the  distal left main pulmonary artery extending into the lower lobe artery. There was nonocclusive thrombus in upper lobe branches. Right pulmonary arteriography demonstrates significant thrombus in the distal right main pulmonary artery extending into the lower lobe pulmonary artery. Poor flow was noted in lower lobe branches and there was also lack of good visualization of upper lobe supply.  Pulmonary arterial pressure measurements:  Both main pulmonary artery segments demonstrated identical pressures of 51/22 mm Hg with mean pressure of 33 mm Hg.  Following the procedure, both ultrasound assisted infusion catheter tips terminate within the distal aspects of the bilateral lower lobe sub segmental pulmonary arteries.  IMPRESSION: 1. Successful initiation of bilateral ultrasound assisted catheter directed pulmonary arterial lysis for submassive pulmonary embolism and right-sided heart strain. Bilateral EKOS thrombolytic catheters were placed and advanced into lower lobe pulmonary artery branches. Infusion of tPA was begun through each catheter at a rate of 1 milligram/hour. 2. Markedly elevated pressure measurements within the right and left pulmonary arteries compatible with critical pulmonary arterial hypertension. 3. Pulmonary arteriography confirms the presence of significant thrombus in both left and right pulmonary arteries extending into lower lobe pulmonary artery branches. Thrombus in the right lower lobe pulmonary artery is nearly occlusive.  PLAN: The patient will return to IR tomorrow morning for repeat pressure measurements and removal of the catheters. After 12 hour infusion via each catheter, infusion will be converted to saline overnight.   Electronically Signed   By: Irish LackGlenn  Yamagata M.D.   On: 08/29/2013 17:24   Ir [place] Guide Vasc Access Right  08/29/2013   INDICATION: Submassive bilateral pulmonary embolism with right heart strain and pulmonary hypertension by echocardiography.  EXAM:  1. ULTRASOUND  GUIDANCE FOR VENOUS ACCESS X 2 2. BILATERAL PULMONARY ARTERIOGRAPHY 3. FLUOROSCOPIC GUIDED PLACEMENT OF BILATERAL PULMONARY ARTERIAL LYTIC INFUSION CATHETERS 4. ULTRASOUND ASSISTED THROMBOLYTIC THERAPY OF LEFT PULMONARY ARTERY 5. ULTRASOUND ASSISTED THROMBOLYTIC THERAPY OF RIGHT PULMONARY ARTERY  COMPARISON:  CTA OF THE CHEST, 08/27/2013  MEDICATIONS: Versed 1.0 mg IV; Fentanyl 25 mcg IV  Sedation time:  CONTRAST:  50 mL Omnipaque 300  FLUOROSCOPY TIME:  11 minutes 24 seconds.  COMPLICATIONS: None immediate  TECHNIQUE: Informed written consent was obtained from the patient after a discussion of the risks, benefits and alternatives to treatment. Questions regarding the procedure were encouraged and answered.  The right groin was prepped and draped in the usual sterile fashion, and a sterile drape was applied covering the operative field. Maximum barrier sterile technique with sterile gowns and gloves were used for the procedure. A timeout was performed prior to the initiation of the procedure. Local anesthesia was provided with 1% lidocaine.  Under direct ultrasound guidance, the right common femoral vein was accessed with a micro puncture set ultimately allowing placement of a 6 French vascular sheath. Adjacent to this initial access, the right common femoral vein was again accessed with a micropuncture sheath ultimately allowing placement of a 6 French vascular sheath.  With the use of a guidewire, a 5 French catheter was advanced into the right main pulmonary artery and a left pulmonary arteriogram was performed with hand injection. Arteriography was also performed at the level of the left lower lobe pulmonary artery. Pressure measurements were then obtained from the left pulmonary artery.  Over an exchange length Rosen wire, the pigtail catheter was exchanged for a 105/18 cm multi side-hole EKOS ultrasound assisted infusion catheter.  With the use of a guidewire, a 5 French catheter was advanced into  the right main pulmonary artery. Pulmonary artery pressures were obtained. A pulmonary arteriogram was performed with hand injection. Arteriography was also performed at the level of the right lower lobe pulmonary artery.  Over an exchange length Rosen wire, the pigtail catheter was exchanged for a 105/12 cm multi side-hole EKOS ultrasound assisted infusion catheter.  A postprocedural fluoroscopic image was obtained to confirm final catheter positioning.  Both vascular sheath were secured at the right groin with interrupted 0 Prolene sutures. The external catheter tubing was secured and thrombolytic lytic therapy was initiated. Thrombolytic therapy was begun via the EKOS system with infusion of tPA at a dose of 1 mg/hr through each infusion catheter.  The patient tolerated the procedure well without immediate postprocedural complication.  FINDINGS: Pulmonary arteriography demonstrated significant nonocclusive thrombus in the distal left main pulmonary artery extending into the lower lobe artery. There was nonocclusive thrombus in upper lobe branches. Right pulmonary arteriography demonstrates significant thrombus in the distal right main pulmonary artery extending into the lower lobe pulmonary artery. Poor flow was noted in lower lobe branches and there was also lack of good visualization of upper lobe supply.  Pulmonary arterial pressure measurements:  Both main pulmonary artery segments demonstrated identical pressures of 51/22 mm Hg with mean pressure of 33 mm Hg.  Following the procedure, both ultrasound assisted infusion catheter tips terminate within the distal aspects of the bilateral lower lobe sub segmental pulmonary arteries.  IMPRESSION: 1. Successful initiation of bilateral ultrasound assisted catheter directed pulmonary arterial lysis for submassive pulmonary embolism and right-sided heart strain. Bilateral EKOS thrombolytic catheters were placed and advanced into lower lobe pulmonary artery branches.  Infusion of tPA was begun through each catheter  at a rate of 1 milligram/hour. 2. Markedly elevated pressure measurements within the right and left pulmonary arteries compatible with critical pulmonary arterial hypertension. 3. Pulmonary arteriography confirms the presence of significant thrombus in both left and right pulmonary arteries extending into lower lobe pulmonary artery branches. Thrombus in the right lower lobe pulmonary artery is nearly occlusive.  PLAN: The patient will return to IR tomorrow morning for repeat pressure measurements and removal of the catheters. After 12 hour infusion via each catheter, infusion will be converted to saline overnight.   Electronically Signed   By: Irish Lack M.D.   On: 08/29/2013 17:24   Ir Infusion Thrombol Arterial Initial (ms)  08/29/2013   INDICATION: Submassive bilateral pulmonary embolism with right heart strain and pulmonary hypertension by echocardiography.  EXAM: 1. ULTRASOUND GUIDANCE FOR VENOUS ACCESS X 2 2. BILATERAL PULMONARY ARTERIOGRAPHY 3. FLUOROSCOPIC GUIDED PLACEMENT OF BILATERAL PULMONARY ARTERIAL LYTIC INFUSION CATHETERS 4. ULTRASOUND ASSISTED THROMBOLYTIC THERAPY OF LEFT PULMONARY ARTERY 5. ULTRASOUND ASSISTED THROMBOLYTIC THERAPY OF RIGHT PULMONARY ARTERY  COMPARISON:  CTA OF THE CHEST, 08/27/2013  MEDICATIONS: Versed 1.0 mg IV; Fentanyl 25 mcg IV  Sedation time:  CONTRAST:  50 mL Omnipaque 300  FLUOROSCOPY TIME:  11 minutes 24 seconds.  COMPLICATIONS: None immediate  TECHNIQUE: Informed written consent was obtained from the patient after a discussion of the risks, benefits and alternatives to treatment. Questions regarding the procedure were encouraged and answered.  The right groin was prepped and draped in the usual sterile fashion, and a sterile drape was applied covering the operative field. Maximum barrier sterile technique with sterile gowns and gloves were used for the procedure. A timeout was performed prior to the  initiation of the procedure. Local anesthesia was provided with 1% lidocaine.  Under direct ultrasound guidance, the right common femoral vein was accessed with a micro puncture set ultimately allowing placement of a 6 French vascular sheath. Adjacent to this initial access, the right common femoral vein was again accessed with a micropuncture sheath ultimately allowing placement of a 6 French vascular sheath.  With the use of a guidewire, a 5 French catheter was advanced into the right main pulmonary artery and a left pulmonary arteriogram was performed with hand injection. Arteriography was also performed at the level of the left lower lobe pulmonary artery. Pressure measurements were then obtained from the left pulmonary artery.  Over an exchange length Rosen wire, the pigtail catheter was exchanged for a 105/18 cm multi side-hole EKOS ultrasound assisted infusion catheter.  With the use of a guidewire, a 5 French catheter was advanced into the right main pulmonary artery. Pulmonary artery pressures were obtained. A pulmonary arteriogram was performed with hand injection. Arteriography was also performed at the level of the right lower lobe pulmonary artery.  Over an exchange length Rosen wire, the pigtail catheter was exchanged for a 105/12 cm multi side-hole EKOS ultrasound assisted infusion catheter.  A postprocedural fluoroscopic image was obtained to confirm final catheter positioning.  Both vascular sheath were secured at the right groin with interrupted 0 Prolene sutures. The external catheter tubing was secured and thrombolytic lytic therapy was initiated. Thrombolytic therapy was begun via the EKOS system with infusion of tPA at a dose of 1 mg/hr through each infusion catheter.  The patient tolerated the procedure well without immediate postprocedural complication.  FINDINGS: Pulmonary arteriography demonstrated significant nonocclusive thrombus in the distal left main pulmonary artery extending into the  lower lobe artery. There was nonocclusive thrombus in upper lobe  branches. Right pulmonary arteriography demonstrates significant thrombus in the distal right main pulmonary artery extending into the lower lobe pulmonary artery. Poor flow was noted in lower lobe branches and there was also lack of good visualization of upper lobe supply.  Pulmonary arterial pressure measurements:  Both main pulmonary artery segments demonstrated identical pressures of 51/22 mm Hg with mean pressure of 33 mm Hg.  Following the procedure, both ultrasound assisted infusion catheter tips terminate within the distal aspects of the bilateral lower lobe sub segmental pulmonary arteries.  IMPRESSION: 1. Successful initiation of bilateral ultrasound assisted catheter directed pulmonary arterial lysis for submassive pulmonary embolism and right-sided heart strain. Bilateral EKOS thrombolytic catheters were placed and advanced into lower lobe pulmonary artery branches. Infusion of tPA was begun through each catheter at a rate of 1 milligram/hour. 2. Markedly elevated pressure measurements within the right and left pulmonary arteries compatible with critical pulmonary arterial hypertension. 3. Pulmonary arteriography confirms the presence of significant thrombus in both left and right pulmonary arteries extending into lower lobe pulmonary artery branches. Thrombus in the right lower lobe pulmonary artery is nearly occlusive.  PLAN: The patient will return to IR tomorrow morning for repeat pressure measurements and removal of the catheters. After 12 hour infusion via each catheter, infusion will be converted to saline overnight.   Electronically Signed   By: Irish Lack M.D.   On: 08/29/2013 17:24   Ir Infusion Thrombol Arterial Initial (ms)  08/29/2013   INDICATION: Submassive bilateral pulmonary embolism with right heart strain and pulmonary hypertension by echocardiography.  EXAM: 1. ULTRASOUND GUIDANCE FOR VENOUS ACCESS X 2 2.  BILATERAL PULMONARY ARTERIOGRAPHY 3. FLUOROSCOPIC GUIDED PLACEMENT OF BILATERAL PULMONARY ARTERIAL LYTIC INFUSION CATHETERS 4. ULTRASOUND ASSISTED THROMBOLYTIC THERAPY OF LEFT PULMONARY ARTERY 5. ULTRASOUND ASSISTED THROMBOLYTIC THERAPY OF RIGHT PULMONARY ARTERY  COMPARISON:  CTA OF THE CHEST, 08/27/2013  MEDICATIONS: Versed 1.0 mg IV; Fentanyl 25 mcg IV  Sedation time:  CONTRAST:  50 mL Omnipaque 300  FLUOROSCOPY TIME:  11 minutes 24 seconds.  COMPLICATIONS: None immediate  TECHNIQUE: Informed written consent was obtained from the patient after a discussion of the risks, benefits and alternatives to treatment. Questions regarding the procedure were encouraged and answered.  The right groin was prepped and draped in the usual sterile fashion, and a sterile drape was applied covering the operative field. Maximum barrier sterile technique with sterile gowns and gloves were used for the procedure. A timeout was performed prior to the initiation of the procedure. Local anesthesia was provided with 1% lidocaine.  Under direct ultrasound guidance, the right common femoral vein was accessed with a micro puncture set ultimately allowing placement of a 6 French vascular sheath. Adjacent to this initial access, the right common femoral vein was again accessed with a micropuncture sheath ultimately allowing placement of a 6 French vascular sheath.  With the use of a guidewire, a 5 French catheter was advanced into the right main pulmonary artery and a left pulmonary arteriogram was performed with hand injection. Arteriography was also performed at the level of the left lower lobe pulmonary artery. Pressure measurements were then obtained from the left pulmonary artery.  Over an exchange length Rosen wire, the pigtail catheter was exchanged for a 105/18 cm multi side-hole EKOS ultrasound assisted infusion catheter.  With the use of a guidewire, a 5 French catheter was advanced into the right main pulmonary artery.  Pulmonary artery pressures were obtained. A pulmonary arteriogram was performed with hand injection. Arteriography was also  performed at the level of the right lower lobe pulmonary artery.  Over an exchange length Rosen wire, the pigtail catheter was exchanged for a 105/12 cm multi side-hole EKOS ultrasound assisted infusion catheter.  A postprocedural fluoroscopic image was obtained to confirm final catheter positioning.  Both vascular sheath were secured at the right groin with interrupted 0 Prolene sutures. The external catheter tubing was secured and thrombolytic lytic therapy was initiated. Thrombolytic therapy was begun via the EKOS system with infusion of tPA at a dose of 1 mg/hr through each infusion catheter.  The patient tolerated the procedure well without immediate postprocedural complication.  FINDINGS: Pulmonary arteriography demonstrated significant nonocclusive thrombus in the distal left main pulmonary artery extending into the lower lobe artery. There was nonocclusive thrombus in upper lobe branches. Right pulmonary arteriography demonstrates significant thrombus in the distal right main pulmonary artery extending into the lower lobe pulmonary artery. Poor flow was noted in lower lobe branches and there was also lack of good visualization of upper lobe supply.  Pulmonary arterial pressure measurements:  Both main pulmonary artery segments demonstrated identical pressures of 51/22 mm Hg with mean pressure of 33 mm Hg.  Following the procedure, both ultrasound assisted infusion catheter tips terminate within the distal aspects of the bilateral lower lobe sub segmental pulmonary arteries.  IMPRESSION: 1. Successful initiation of bilateral ultrasound assisted catheter directed pulmonary arterial lysis for submassive pulmonary embolism and right-sided heart strain. Bilateral EKOS thrombolytic catheters were placed and advanced into lower lobe pulmonary artery branches. Infusion of tPA was begun through  each catheter at a rate of 1 milligram/hour. 2. Markedly elevated pressure measurements within the right and left pulmonary arteries compatible with critical pulmonary arterial hypertension. 3. Pulmonary arteriography confirms the presence of significant thrombus in both left and right pulmonary arteries extending into lower lobe pulmonary artery branches. Thrombus in the right lower lobe pulmonary artery is nearly occlusive.  PLAN: The patient will return to IR tomorrow morning for repeat pressure measurements and removal of the catheters. After 12 hour infusion via each catheter, infusion will be converted to saline overnight.   Electronically Signed   By: Irish Lack M.D.   On: 08/29/2013 17:24   Ir Rande Lawman F/u Eval Art/ven Final Day (ms)  08/30/2013   CLINICAL DATA:  Status post pulmonary embolism lysis check  EXAM: IR THROMB F/U EVAL ART/VEN FINAL DAY  FLUOROSCOPY TIME:  24 seconds.  MEDICATIONS AND MEDICAL HISTORY: None  ANESTHESIA/SEDATION: Moderate sedation time: None minutes  CONTRAST:  None  PROCEDURE: The procedure, risks, benefits, and alternatives were explained to the patient. Questions regarding the procedure were encouraged and answered. The patient understands and consents to the procedure.  Fluoroscopic imaging was utilized to confirm stable position of the right and left pulmonary artery catheters. Pulmonary artery pressure measurements were obtained. Pressures were determined of the 32 over 12 mm Hg with a mean of 20 mm Hg for the right and left pulmonary artery. The catheters and sheath were removed and hemostasis was achieved with direct pressure.  FINDINGS: Stable position of the pulmonary artery catheters.  COMPLICATIONS: None  IMPRESSION: Successful pulmonary artery lysis. Post lysis mean arterial pressures were 20 mm Hg bilaterally.   Electronically Signed   By: Maryclare Bean M.D.   On: 08/30/2013 17:12    Assessment/Plan: S/p (B)PE thrombolysis with end of procedure MAP of 20 mmHg  bilaterally Remains on Heparin gtt For follow up CT Angio tomorrow. IR will cont to follow    LOS:  4 days    Brayton El PA-C 08/31/2013 9:01 AM

## 2013-08-31 NOTE — Progress Notes (Signed)
Patient Demographics  Isaiah Payne, is a 38 y.o. male, DOB - [birth year], ZOX:[id number]  Admit date - 08/27/2013   Admitting Physician Osvaldo Shipper, MD  Outpatient Primary MD for the patient is Darrow Bussing, MD  LOS - 4   Chief Complaint  Patient presents with  . Shortness of Breath        Subjective:   Isaiah Payne today has, No headache, No chest pain, No abdominal pain - No Nausea, No new weakness tingling or numbness, No Cough - SOB at rest.   Assessment & Plan    1. Acute massive pulmonary embolism with bilateral lower extremity DVT in a patient with lupus anticoagulant positive hypercoagulable state.   Much improved post TPA by IR on 08-29-13, seen by pulmonary critical care as well, pulmonary artery pressure certainly has improved and down from 53 to 30, will transition to xaralto in the morning of 09-01-13 after another CT angiogram of chest on 09/01/2013 discussed with IR physician Dr. Bonnielee Haff. Will repeat limited echogram on 09/01/2011 to reevaluate RV and PA pressures.     2. Per patient he was diagnosed with pulmonary hypertension in [place]. TTE noted, he is set up to see Dr. Sherene Sires pulmonary post DC, question if he's been getting small PEs intermittently due to his underlying hypercoagulable state. Will ambulate and evaluate for any oxygen need. Repeat CT angiogram on Monday. Repeat limited echogram on 08/31/2013.    3.GERD - continue PPI.    4. Mild thrombocytopenia secondary to consumption from massive clot burden. No acute issue monitor.    5. T wave changes on EKG. Chest pain-free, likely strain pattern due to massive PE and clot burden, troponin 2 sets negative. Echo noted.    6. Positive lupus anticoagulant. [name] outpatient setting life long, outpatient  oncology hematology followup. Requested to get blood relatives checked.      Code Status: Full  Family Communication: None present  Disposition Plan: Home    Procedures CT angiogram chest, lower estimate the venous duplex, transthoracic echo gram   TTE - Left ventricle: The cavity size was normal. There was moderate concentric hypertrophy. Systolic function was normal. The estimated ejection fraction was in the range of 50% to 55%. Wall motion was normal; there were no regional wall motion abnormalities. - Ventricular septum: Septal motion showed abnormal function and dyssynergy. The contour showed systolic flattening. These changes are consistent with RV pressure overload. - Right ventricle: The cavity size was dilated. Wall thickness was normal. Systolic function was reduced. - Right atrium: The atrium was moderately dilated. - Pulmonary arteries: Systolic pressure was moderately increased. PA peak pressure: 53 mm Hg (S).   TPA by IR - 08-29-13    Consults  pulmonary care, IR, Oncology    Medications  Scheduled Meds: . famotidine  20 mg Oral QHS  . pantoprazole  40 mg Oral QAC breakfast  . sodium chloride  3 mL Intravenous Q12H  . sodium chloride  3 mL Intravenous Q12H   Continuous Infusions: . sodium chloride Stopped (08/30/13 0900)  . sodium chloride Stopped (08/30/13 0900)  . sodium chloride Stopped (08/30/13 0900)  . sodium chloride Stopped (08/30/13 0900)  . heparin 1,350 Units/hr (08/30/13 2104)   PRN Meds:.sodium chloride, acetaminophen, albuterol, morphine injection,  ondansetron (ZOFRAN) IV, oxyCODONE-acetaminophen, sodium chloride  DVT Prophylaxis  heparin drip  Lab Results  Component Value Date   PLT 145* 08/31/2013    Antibiotics    Anti-infectives   None          Objective:   Filed Vitals:   08/30/13 1530 08/30/13 1714 08/30/13 2102 08/31/13 0456  BP: 115/61 103/60 104/64 102/61  Pulse: 59 54 53 56  Temp:  97.5 F (36.4 C) 97.8 F  (36.6 C) 97.8 F (36.6 C)  TempSrc:  Axillary Oral Oral  Resp: 17 18 16 16   Height:      Weight:      SpO2: 98% 99% 99% 99%    Wt Readings from Last 3 Encounters:  08/27/13 79.6 kg (175 lb 7.8 oz)  08/26/13 80.74 kg (178 lb)  07/12/06 81.693 kg (180 lb 1.6 oz)     Intake/Output Summary (Last 24 hours) at 08/31/13 1103 Last data filed at 08/31/13 0913  Gross per 24 hour  Intake 338.75 ml  Output   1585 ml  Net -1246.25 ml     Physical Exam  Awake Alert, Oriented X 3, No new F.N deficits, Normal affect [place].AT,PERRAL Supple Neck,No JVD, No cervical lymphadenopathy appriciated.  Symmetrical Chest wall movement, Good air movement bilaterally, CTAB RRR,No Gallops,Rubs or new Murmurs, No Parasternal Heave +ve B.Sounds, Abd Soft, No tenderness, No organomegaly appriciated, No rebound - guarding or rigidity. No Cyanosis, Clubbing or edema, No new Rash or bruise  R.Fem catheter   Data Review   Micro Results Recent Results (from the past 240 hour(s))  MRSA PCR SCREENING     Status: None   Collection Time    08/27/13  7:06 PM      Result Value Ref Range Status   MRSA by PCR NEGATIVE  NEGATIVE Final   Comment:            The GeneXpert MRSA Assay (FDA     approved for NASAL specimens     only), is one component of a     comprehensive MRSA colonization     surveillance program. It is not     intended to diagnose MRSA     infection nor to guide or     monitor treatment for     MRSA infections.    Radiology Reports Ct Angio Chest W/cm &/or Wo Cm  08/27/2013   CLINICAL DATA:  Shortness of breath with exertion for 7-8 months, history DVT, question pulmonary embolism  EXAM: CT ANGIOGRAPHY CHEST WITH CONTRAST  TECHNIQUE: Multidetector CT imaging of the chest was performed using the standard protocol during bolus administration of intravenous contrast. Multiplanar CT image reconstructions and MIPs were obtained to evaluate the vascular anatomy.  CONTRAST:  80mL OMNIPAQUE IOHEXOL  350 MG/ML SOLN  COMPARISON:  CT chest 04/30/2013  FINDINGS: Aorta normal caliber without aneurysm or dissection.  Large BILATERAL pulmonary emboli identified.  Saddle embolus at bifurcation of RIGHT main pulmonary artery extending into RIGHT upper lobe, RIGHT lower lobe and RIGHT middle lobe.  Large saddle embolus at bifurcation of LEFT pulmonary artery extending into LEFT upper and LEFT lower lobes.  Associated increased RV/LV ratio of 1.36 indicative of RIGHT heart strain and at least submassive pulmonary embolism.  Normal-size lymph nodes at the hila bilaterally.  Visualized portion of upper abdomen normal appearance.  Minimal pericardial fluid inferiorly.  Lungs clear.  No pleural effusion or pneumothorax.  Osseous structures unremarkable.  Review of the MIP images confirms the above findings.  IMPRESSION: BILATERAL pulmonary emboli including saddle emboli at the bifurcations of the LEFT and RIGHT pulmonary arteries extending into all lobes.  CT evidence of right heartstrain (RV/LV Ratio = 1.36) consistent with at least submassive (intermediate risk) PE.  The presence of right heart strain has been associated with an increased risk of morbidity and mortality.  Critical Value/emergent results were called by telephone at the time of interpretation on 08/27/2013 at 2:27 pm to Dr. Sandrea Hughs , who verbally acknowledged these results.   Electronically Signed   By: Ulyses Southward M.D.   On: 08/27/2013 14:32   Dg Chest Port 1 View  08/28/2013   CLINICAL DATA:  Evaluate for atelectasis or infarct. Bilateral pulmonary emboli.  EXAM: PORTABLE CHEST - 1 VIEW  COMPARISON:  08/27/2013 CTA  FINDINGS: Midline trachea. Borderline cardiomegaly. Insert pleural Clear lungs.  IMPRESSION: Borderline cardiomegaly, without acute disease.   Electronically Signed   By: Jeronimo Greaves M.D.   On: 08/28/2013 08:10    CBC  Recent Labs Lab 08/26/13 1258 08/27/13 1608  08/29/13 1615 08/29/13 2148 08/30/13 0352 08/30/13 1042  08/31/13 0510  WBC 7.1 6.9  < > 6.0 4.7 5.0 4.1 3.7*  HGB 15.2 14.4  < > 14.2 13.4 13.2 13.8 14.2  HCT 45.9 41.8  < > 41.8 39.7 39.5 41.5 42.9  PLT 153.0 139*  < > 132* 129* 124* 120* 145*  MCV 82.9 81.3  < > 81.0 81.2 80.9 82.3 82.8  MCH  --  28.0  < > 27.5 27.4 27.0 27.4 27.4  MCHC 33.1 34.4  < > 34.0 33.8 33.4 33.3 33.1  RDW 15.0 14.2  < > 14.2 14.1 14.1 14.1 14.0  LYMPHSABS 2.8 2.5  --   --   --   --   --   --   MONOABS 0.6 0.6  --   --   --   --   --   --   EOSABS 0.3 0.3  --   --   --   --   --   --   BASOSABS 0.0 0.1  --   --   --   --   --   --   < > = values in this interval not displayed.  Chemistries   Recent Labs Lab 08/26/13 1258 08/27/13 1608 08/28/13 0315 08/30/13 0352  NA 138 140 140 138  K 4.5 4.3 4.1 4.2  CL 104 102 106 104  CO2 24 24 22 22   GLUCOSE 81 82 100* 90  BUN 9 9 11 8   CREATININE 1.0 0.90 0.82 0.84  CALCIUM 9.5 9.2 8.7 8.9  AST  --   --  23  --   ALT  --   --  32  --   ALKPHOS  --   --  88  --   BILITOT  --   --  1.6*  --    ------------------------------------------------------------------------------------------------------------------ estimated creatinine clearance is 130.9 ml/min (by C-G formula based on Cr of 0.84). ------------------------------------------------------------------------------------------------------------------ No results found for this basename: HGBA1C,  in the last 72 hours ------------------------------------------------------------------------------------------------------------------ No results found for this basename: CHOL, HDL, LDLCALC, TRIG, CHOLHDL, LDLDIRECT,  in the last 72 hours ------------------------------------------------------------------------------------------------------------------ No results found for this basename: TSH, T4TOTAL, FREET3, T3FREE, THYROIDAB,  in the last 72 hours ------------------------------------------------------------------------------------------------------------------ No  results found for this basename: VITAMINB12, FOLATE, FERRITIN, TIBC, IRON, RETICCTPCT,  in the last 72 hours  Coagulation profile No results found for this basename: INR, PROTIME,  in  the last 168 hours  No results found for this basename: DDIMER,  in the last 72 hours  Cardiac Enzymes  Recent Labs Lab 08/27/13 1608 08/28/13 0315  TROPONINI <0.30 <0.30   ------------------------------------------------------------------------------------------------------------------ No components found with this basename: POCBNP,      Time Spent in minutes   35   Jacarie Pate K M.D on 08/31/2013 at 11:03 AM  Between 7am to 7pm - Pager - [phone]  After 7pm go to [email or web address] - password [email or web address]  And look for the night coverage person covering for me after hours  [facility] Office  [phone]   **Disclaimer: This note may have been dictated with voice recognition software. Similar sounding words can inadvertently be transcribed and this note may contain transcription errors which may not have been corrected upon publication of note.**

## 2013-09-01 ENCOUNTER — Inpatient Hospital Stay (HOSPITAL_COMMUNITY): Payer: BC Managed Care – PPO

## 2013-09-01 ENCOUNTER — Other Ambulatory Visit (HOSPITAL_COMMUNITY): Payer: BC Managed Care – PPO

## 2013-09-01 DIAGNOSIS — I2699 Other pulmonary embolism without acute cor pulmonale: Secondary | ICD-10-CM

## 2013-09-01 DIAGNOSIS — I059 Rheumatic mitral valve disease, unspecified: Secondary | ICD-10-CM

## 2013-09-01 LAB — CBC
HCT: 40.9 % (ref 39.0–52.0)
Hemoglobin: 13.7 g/dL (ref 13.0–17.0)
MCH: 27.3 pg (ref 26.0–34.0)
MCHC: 33.5 g/dL (ref 30.0–36.0)
MCV: 81.5 fL (ref 78.0–100.0)
Platelets: 142 10*3/uL — ABNORMAL LOW (ref 150–400)
RBC: 5.02 MIL/uL (ref 4.22–5.81)
RDW: 14.1 % (ref 11.5–15.5)
WBC: 4.3 10*3/uL (ref 4.0–10.5)

## 2013-09-01 LAB — HEPARIN LEVEL (UNFRACTIONATED): Heparin Unfractionated: 0.5 IU/mL (ref 0.30–0.70)

## 2013-09-01 MED ORDER — RIVAROXABAN (XARELTO) VTE STARTER PACK (15 & 20 MG): ORAL_TABLET | ORAL | Status: DC

## 2013-09-01 MED ORDER — RIVAROXABAN 20 MG PO TABS: 20.0000 mg | ORAL_TABLET | Freq: Every day | ORAL | Status: DC

## 2013-09-01 MED ORDER — RIVAROXABAN 15 MG PO TABS
15.0000 mg | ORAL_TABLET | Freq: Two times a day (BID) | ORAL | Status: DC
  Administered 2013-09-01: 15 mg via ORAL
  Filled 2013-09-01 (×3): qty 1

## 2013-09-01 MED ORDER — RIVAROXABAN (XARELTO) EDUCATION KIT FOR DVT/PE PATIENTS
PACK | Freq: Once | Status: AC
  Administered 2013-09-01: 09:00:00
  Filled 2013-09-01: qty 1

## 2013-09-01 MED ORDER — IOHEXOL 350 MG/ML SOLN
100.0000 mL | Freq: Once | INTRAVENOUS | Status: AC | PRN
  Administered 2013-09-01: 100 mL via INTRAVENOUS

## 2013-09-01 MED ORDER — RIVAROXABAN 15 MG PO TABS: 15.0000 mg | ORAL_TABLET | Freq: Two times a day (BID) | ORAL | Status: DC

## 2013-09-01 NOTE — Progress Notes (Signed)
09/01/2013 1420 Nursing note Verbal order Ralene MuskratPamela Turpin PAC ok to discharge home. Dr. Thedore MinsSingh paged and made aware. Verbal orders as well ok to d/c home. Pt. Updated on incision site care. Pt. Verbalized understanding. Dressing dry and intact. No bleeding or hematoma noted at discharge. D/c home per orders. Laney Bagshaw, Blanchard KelchStephanie Ingold

## 2013-09-01 NOTE — Progress Notes (Signed)
09/01/2013 11:57 AM Nursing note Discharge avs form, medications already taken today and those due this evening given and explained to patient. Correct use and taper of Xarelto reviewed using teach back method and patient preformed return verbalization of instructions. Follow up appointments and when to call MD reviewed. D/c iv line and tele by NT. Pt. Refused to be escorted to front entrance via wheelchair, pt.ambulated with NT to front entrance and d/c home with spouse.  Gianluca Chhim, Blanchard KelchStephanie Ingold

## 2013-09-01 NOTE — Discharge Summary (Addendum)
Isaiah Payne, is a 38 y.o. male  DOB 1975/11/22  MRN [id number].  Admission date:  08/27/2013  Admitting Physician  Osvaldo Shipper, MD  Discharge Date:  09/01/2013   Primary MD  Darrow Bussing, MD  Recommendations for primary care physician for things to follow:   Adjust xaralto dose after 3 weeks, patient needs outpatient hematology-pulmonary followup.  Family members to be checked for lupus anticoagulant   Admission Diagnosis  Dyspnea [786.09] Abnormal EKG [794.31] Acute massive pulmonary embolism [415.19]   Discharge Diagnosis  Dyspnea [786.09] Abnormal EKG [794.31] Acute massive pulmonary embolism [415.19]    Principal Problem:   Acute massive pulmonary embolism Active Problems:   Dyspnea   History of DVT (deep vein thrombosis)      Past Medical History  Diagnosis Date  . DVT (deep venous thrombosis) 2007    Left leg- txed with warfarin x 1 yr    History reviewed. No pertinent past surgical history.     History of present illness and  Hospital Course:     Kindly see H&P for history of present illness and admission details, please review complete Labs, Consult reports and Test reports for all details in brief  HPI  from the history and physical done on the day of admission   Isaiah Payne is a 38 y.o. 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 He tells me that he was in [place] recently and return from there about a week ago. This was a long flight. He also has been having cough with clear expectoration. Denies any blood in the sputum. Denies any chest pain whatsoever. No nausea, vomiting. For the last day or so he has noticed some dizziness and lightheadedness, but denies any syncopal episode. Denies any leg  swelling. Interestingly when he was in [place] he went to see a doctor there and complained of shortness of breath. Dopplers were done, which were negative. No CT scan was done. And apparently, blood work was done, which suggested protein S deficiency.    Hospital Course    1. Acute massive pulmonary embolism with bilateral lower extremity DVT in a patient with lupus anticoagulant positive hypercoagulable state.   Much improved post TPA by IR on 08-29-13, seen by pulmonary critical care as well, pulmonary artery pressure certainly has improved and down from 53 to 30, has been transitioned to xaralto in the morning of 09-01-13 repeat CT and limited echogram shows much improvement in pulmonary artery pressure and right heart strain.  We'll request PCP to arrange for outpatient hematology and pulmonary followup. Can consider repeating echogram in one month. Kindly adjust xaralto dose in 21 days from now.     2. Mild pulmonary hypertension. As in #1 above. Improved after TPA and anticoagulation. Outpatient pulmonary followup. May repeat echo in one month.    3.GERD - continue PPI.     4. Mild thrombocytopenia secondary to consumption from massive clot burden. No acute issue  monitor.      5. T wave changes on EKG. Chest pain-free, likely strain pattern due to massive PE and clot burden, troponin 2 sets negative. Echo stable .     6. Positive lupus anticoagulant. [name] outpatient setting life [facility], outpatient oncology hematology followup. Requested to get blood relatives checked.      Discharge Condition: Stable   Follow UP  Follow-up Information   Follow up with Darrow Bussing, MD. Schedule an appointment as soon as possible for a visit in 1 week.   Specialty:  Family Medicine   Contact information:   [address] [place] [address] [phone]       Follow up with Sandrea Hughs, MD On 09/09/2013. (Pulmonary Hypertension at 8.30am)    Specialty:   Pulmonary Disease   Contact information:   [address] [place] [address] [phone]       Follow up with [facility], NI, MD. Schedule an appointment as soon as possible for a visit in 1 week.   Specialty:  Hematology and Oncology   Contact information:   [address] [place] [address] [phone]         Discharge Instructions  and  Discharge Medications          Discharge Instructions   Discharge instructions    Complete by:  As directed   Follow with Primary MD Darrow Bussing, MD in 7 days   Get CBC, CMP, 2 view Chest X ray checked  by Primary MD next visit.    Activity: As tolerated with Full fall precautions use walker/cane & assistance as needed   Disposition Home     Diet: Heart Healthy    For Heart failure patients - Check your Weight same time everyday, if you gain over 2 pounds, or you develop in leg swelling, experience more shortness of breath or chest pain, call your Primary MD immediately. Follow Cardiac Low Salt Diet and 1.8 lit/day fluid restriction.   On your next visit with her primary care physician please Get Medicines reviewed and adjusted.  Please request your Prim.MD to go over all Hospital Tests and Procedure/Radiological results at the follow up, please get all Hospital records sent to your Prim MD by signing hospital release before you go home.   If you experience worsening of your admission symptoms, develop shortness of breath, life threatening emergency, suicidal or homicidal thoughts you must seek medical attention immediately by calling 911 or calling your MD immediately  if symptoms less severe.  You Must read complete instructions/literature along with all the possible adverse reactions/side effects for all the Medicines you take and that have been prescribed to you. Take any new Medicines after you have completely understood and accpet all the possible adverse reactions/side effects.   Do not drive, operating heavy  machinery, perform activities at heights, swimming or participation in water activities or provide baby sitting services if your were admitted for syncope or siezures until you have seen by Primary MD or a Neurologist and advised to do so again.  Do not drive when taking Pain medications.    Do not take more than prescribed Pain, Sleep and Anxiety Medications  Special Instructions: If you have smoked or chewed Tobacco  in the last 2 yrs please stop smoking, stop any regular Alcohol  and or any Recreational drug use.  Wear Seat belts while driving.   Please note  You were cared for by a hospitalist during your hospital stay. If you  have any questions about your discharge medications or the care you received while you were in the hospital after you are discharged, you can call the unit and asked to speak with the hospitalist on call if the hospitalist that took care of you is not available. Once you are discharged, your primary care physician will handle any further medical issues. Please note that NO REFILLS for any discharge medications will be authorized once you are discharged, as it is imperative that you return to your primary care physician (or establish a relationship with a primary care physician if you do not have one) for your aftercare needs so that they can reassess your need for medications and monitor your lab values.     Increase activity slowly    Complete by:  As directed             Medication List    STOP taking these medications       aspirin 81 MG tablet      TAKE these medications       famotidine 20 MG tablet  Commonly known as:  PEPCID  Take 20 mg by mouth at bedtime.     pantoprazole 40 MG tablet  Commonly known as:  PROTONIX  Take 1 tablet (40 mg total) by mouth daily. Take 30-60 min before first meal of the day     Rivaroxaban 15 & 20 MG Tbpk  Commonly known as:  XARELTO STARTER PACK  Take as directed on package: Start with one 15mg  tablet by mouth  twice a day with food. On Day 22, switch to one 20mg  tablet once a day with food.          Diet and Activity recommendation: See Discharge Instructions above   Consults obtained - pulmonary critical care, interventional radiology   Major procedures and Radiology Reports - PLEASE review detailed and final reports for all details, in brief -   TTE  - Left ventricle: The cavity size was normal. There was moderate concentric hypertrophy. Systolic function was normal. The estimated ejection fraction was in the range of 50% to 55%. Wall motion was normal; there were no regional wall motion abnormalities. - Ventricular septum: Septal motion showed abnormal function and dyssynergy. The contour showed systolic flattening. These changes are consistent with RV pressure overload. - Right ventricle: The cavity size was dilated. Wall thickness was normal. Systolic function was reduced. - Right atrium: The atrium was moderately dilated. - Pulmonary arteries: Systolic pressure was moderately increased. PA peak pressure: 53 mm Hg (S).     TPA by IR - 08-29-13    Repeat TTE  - Technical notes: Limited echo to assess RVF and PAP. EF 50-55% and PAP on previous echo 8.13.15. - Right ventricle: The cavity size was normal. Wall thickness was normal. - Tricuspid valve: Structurally normal valve. There was mild regurgitation. - Pulmonary arteries: PA peak pressure: 34 mm Hg (S).  Impressions:  - No evidence for RV strain. Compared to prior echo, the PA pressure is reduced from 53 to 34 mmHg.     Ct Angio Chest Pe W/cm &/or Wo Cm  09/01/2013   CLINICAL DATA:  Followup pulmonary embolism.  Status post lipase is.  EXAM: CT ANGIOGRAPHY CHEST WITH CONTRAST  TECHNIQUE: Multidetector CT imaging of the chest was performed using the standard protocol during bolus administration of intravenous contrast. Multiplanar CT image reconstructions and MIPs were obtained to evaluate the vascular anatomy.   CONTRAST:  OMNIPAQUE IOHEXOL 350  MG/ML SOLN  COMPARISON:  08/27/2013  FINDINGS: The chest wall is stable.  The heart is normal in size. No pericardial effusion. The there are persistent small pulmonary emboli bilaterally. Overall significant improved when compared to prior examination. Persistent right heart strain with flattening of the left ventricle. The RV/LV ratio equals 1.12 (Previously 1.36).  Examination of the lung parenchyma demonstrates stable mild emphysematous changes. No acute pulmonary findings. No pulmonary infarction.  The aorta is normal.  Review of the MIP images confirms the above findings.  IMPRESSION: Significant reduction in pulmonary emboli when compared to CT scan of 08/27/2013.  Persistent right heart strain with RV/ LV ratio of 1.12.   Electronically Signed   By: Loralie Champagne M.D.   On: 09/01/2013 01:17   Ct Angio Chest W/cm &/or Wo Cm  08/27/2013   CLINICAL DATA:  Shortness of breath with exertion for 7-8 months, history DVT, question pulmonary embolism  EXAM: CT ANGIOGRAPHY CHEST WITH CONTRAST  TECHNIQUE: Multidetector CT imaging of the chest was performed using the standard protocol during bolus administration of intravenous contrast. Multiplanar CT image reconstructions and MIPs were obtained to evaluate the vascular anatomy.  CONTRAST:  80mL OMNIPAQUE IOHEXOL 350 MG/ML SOLN  COMPARISON:  CT chest 04/30/2013  FINDINGS: Aorta normal caliber without aneurysm or dissection.  Large BILATERAL pulmonary emboli identified.  Saddle embolus at bifurcation of RIGHT main pulmonary artery extending into RIGHT upper lobe, RIGHT lower lobe and RIGHT middle lobe.  Large saddle embolus at bifurcation of LEFT pulmonary artery extending into LEFT upper and LEFT lower lobes.  Associated increased RV/LV ratio of 1.36 indicative of RIGHT heart strain and at least submassive pulmonary embolism.  Normal-size lymph nodes at the hila bilaterally.  Visualized portion of upper abdomen normal  appearance.  Minimal pericardial fluid inferiorly.  Lungs clear.  No pleural effusion or pneumothorax.  Osseous structures unremarkable.  Review of the MIP images confirms the above findings.  IMPRESSION: BILATERAL pulmonary emboli including saddle emboli at the bifurcations of the LEFT and RIGHT pulmonary arteries extending into all lobes.  CT evidence of right heartstrain (RV/LV Ratio = 1.36) consistent with at least submassive (intermediate risk) PE.  The presence of right heart strain has been associated with an increased risk of morbidity and mortality.  Critical Value/emergent results were called by telephone at the time of interpretation on 08/27/2013 at 2:27 pm to Dr. Sandrea Hughs , who verbally acknowledged these results.   Electronically Signed   By: Ulyses Southward M.D.   On: 08/27/2013 14:32   Ir Angiogram Pulmonary Bilateral Selective  08/29/2013   INDICATION: Submassive bilateral pulmonary embolism with right heart strain and pulmonary hypertension by echocardiography.  EXAM: 1. ULTRASOUND GUIDANCE FOR VENOUS ACCESS X 2 2. BILATERAL PULMONARY ARTERIOGRAPHY 3. FLUOROSCOPIC GUIDED PLACEMENT OF BILATERAL PULMONARY ARTERIAL LYTIC INFUSION CATHETERS 4. ULTRASOUND ASSISTED THROMBOLYTIC THERAPY OF LEFT PULMONARY ARTERY 5. ULTRASOUND ASSISTED THROMBOLYTIC THERAPY OF RIGHT PULMONARY ARTERY  COMPARISON:  CTA OF THE CHEST, 08/27/2013  MEDICATIONS: Versed 1.0 mg IV; Fentanyl 25 mcg IV  Sedation time:  CONTRAST:  50 mL Omnipaque 300  FLUOROSCOPY TIME:  11 minutes 24 seconds.  COMPLICATIONS: None immediate  TECHNIQUE: Informed written consent was obtained from the patient after a discussion of the risks, benefits and alternatives to treatment. Questions regarding the procedure were encouraged and answered.  The right groin was prepped and draped in the usual sterile fashion, and a sterile drape was applied covering the operative field. Maximum barrier sterile technique with  sterile gowns and gloves were used  for the procedure. A timeout was performed prior to the initiation of the procedure. Local anesthesia was provided with 1% lidocaine.  Under direct ultrasound guidance, the right common femoral vein was accessed with a micro puncture set ultimately allowing placement of a 6 French vascular sheath. Adjacent to this initial access, the right common femoral vein was again accessed with a micropuncture sheath ultimately allowing placement of a 6 French vascular sheath.  With the use of a guidewire, a 5 French catheter was advanced into the right main pulmonary artery and a left pulmonary arteriogram was performed with hand injection. Arteriography was also performed at the level of the left lower lobe pulmonary artery. Pressure measurements were then obtained from the left pulmonary artery.  Over an exchange length Rosen wire, the pigtail catheter was exchanged for a 105/18 cm multi side-hole EKOS ultrasound assisted infusion catheter.  With the use of a guidewire, a 5 French catheter was advanced into the right main pulmonary artery. Pulmonary artery pressures were obtained. A pulmonary arteriogram was performed with hand injection. Arteriography was also performed at the level of the right lower lobe pulmonary artery.  Over an exchange length Rosen wire, the pigtail catheter was exchanged for a 105/12 cm multi side-hole EKOS ultrasound assisted infusion catheter.  A postprocedural fluoroscopic image was obtained to confirm final catheter positioning.  Both vascular sheath were secured at the right groin with interrupted 0 Prolene sutures. The external catheter tubing was secured and thrombolytic lytic therapy was initiated. Thrombolytic therapy was begun via the EKOS system with infusion of tPA at a dose of 1 mg/hr through each infusion catheter.  The patient tolerated the procedure well without immediate postprocedural complication.  FINDINGS: Pulmonary arteriography demonstrated significant nonocclusive thrombus in  the distal left main pulmonary artery extending into the lower lobe artery. There was nonocclusive thrombus in upper lobe branches. Right pulmonary arteriography demonstrates significant thrombus in the distal right main pulmonary artery extending into the lower lobe pulmonary artery. Poor flow was noted in lower lobe branches and there was also lack of good visualization of upper lobe supply.  Pulmonary arterial pressure measurements:  Both main pulmonary artery segments demonstrated identical pressures of 51/22 mm Hg with mean pressure of 33 mm Hg.  Following the procedure, both ultrasound assisted infusion catheter tips terminate within the distal aspects of the bilateral lower lobe sub segmental pulmonary arteries.  IMPRESSION: 1. Successful initiation of bilateral ultrasound assisted catheter directed pulmonary arterial lysis for submassive pulmonary embolism and right-sided heart strain. Bilateral EKOS thrombolytic catheters were placed and advanced into lower lobe pulmonary artery branches. Infusion of tPA was begun through each catheter at a rate of 1 milligram/hour. 2. Markedly elevated pressure measurements within the right and left pulmonary arteries compatible with critical pulmonary arterial hypertension. 3. Pulmonary arteriography confirms the presence of significant thrombus in both left and right pulmonary arteries extending into lower lobe pulmonary artery branches. Thrombus in the right lower lobe pulmonary artery is nearly occlusive.  PLAN: The patient will return to IR tomorrow morning for repeat pressure measurements and removal of the catheters. After 12 hour infusion via each catheter, infusion will be converted to saline overnight.   Electronically Signed   By: Irish Lack M.D.   On: 08/29/2013 17:24   Ir Angiogram Selective Each Additional Vessel  08/29/2013   INDICATION: Submassive bilateral pulmonary embolism with right heart strain and pulmonary hypertension by echocardiography.   EXAM: 1. ULTRASOUND GUIDANCE FOR  VENOUS ACCESS X 2 2. BILATERAL PULMONARY ARTERIOGRAPHY 3. FLUOROSCOPIC GUIDED PLACEMENT OF BILATERAL PULMONARY ARTERIAL LYTIC INFUSION CATHETERS 4. ULTRASOUND ASSISTED THROMBOLYTIC THERAPY OF LEFT PULMONARY ARTERY 5. ULTRASOUND ASSISTED THROMBOLYTIC THERAPY OF RIGHT PULMONARY ARTERY  COMPARISON:  CTA OF THE CHEST, 08/27/2013  MEDICATIONS: Versed 1.0 mg IV; Fentanyl 25 mcg IV  Sedation time:  CONTRAST:  50 mL Omnipaque 300  FLUOROSCOPY TIME:  11 minutes 24 seconds.  COMPLICATIONS: None immediate  TECHNIQUE: Informed written consent was obtained from the patient after a discussion of the risks, benefits and alternatives to treatment. Questions regarding the procedure were encouraged and answered.  The right groin was prepped and draped in the usual sterile fashion, and a sterile drape was applied covering the operative field. Maximum barrier sterile technique with sterile gowns and gloves were used for the procedure. A timeout was performed prior to the initiation of the procedure. Local anesthesia was provided with 1% lidocaine.  Under direct ultrasound guidance, the right common femoral vein was accessed with a micro puncture set ultimately allowing placement of a 6 French vascular sheath. Adjacent to this initial access, the right common femoral vein was again accessed with a micropuncture sheath ultimately allowing placement of a 6 French vascular sheath.  With the use of a guidewire, a 5 French catheter was advanced into the right main pulmonary artery and a left pulmonary arteriogram was performed with hand injection. Arteriography was also performed at the level of the left lower lobe pulmonary artery. Pressure measurements were then obtained from the left pulmonary artery.  Over an exchange length Rosen wire, the pigtail catheter was exchanged for a 105/18 cm multi side-hole EKOS ultrasound assisted infusion catheter.  With the use of a guidewire, a 5 French catheter  was advanced into the right main pulmonary artery. Pulmonary artery pressures were obtained. A pulmonary arteriogram was performed with hand injection. Arteriography was also performed at the level of the right lower lobe pulmonary artery.  Over an exchange length Rosen wire, the pigtail catheter was exchanged for a 105/12 cm multi side-hole EKOS ultrasound assisted infusion catheter.  A postprocedural fluoroscopic image was obtained to confirm final catheter positioning.  Both vascular sheath were secured at the right groin with interrupted 0 Prolene sutures. The external catheter tubing was secured and thrombolytic lytic therapy was initiated. Thrombolytic therapy was begun via the EKOS system with infusion of tPA at a dose of 1 mg/hr through each infusion catheter.  The patient tolerated the procedure well without immediate postprocedural complication.  FINDINGS: Pulmonary arteriography demonstrated significant nonocclusive thrombus in the distal left main pulmonary artery extending into the lower lobe artery. There was nonocclusive thrombus in upper lobe branches. Right pulmonary arteriography demonstrates significant thrombus in the distal right main pulmonary artery extending into the lower lobe pulmonary artery. Poor flow was noted in lower lobe branches and there was also lack of good visualization of upper lobe supply.  Pulmonary arterial pressure measurements:  Both main pulmonary artery segments demonstrated identical pressures of 51/22 mm Hg with mean pressure of 33 mm Hg.  Following the procedure, both ultrasound assisted infusion catheter tips terminate within the distal aspects of the bilateral lower lobe sub segmental pulmonary arteries.  IMPRESSION: 1. Successful initiation of bilateral ultrasound assisted catheter directed pulmonary arterial lysis for submassive pulmonary embolism and right-sided heart strain. Bilateral EKOS thrombolytic catheters were placed and advanced into lower lobe pulmonary  artery branches. Infusion of tPA was begun through each catheter at a rate of 1  milligram/hour. 2. Markedly elevated pressure measurements within the right and left pulmonary arteries compatible with critical pulmonary arterial hypertension. 3. Pulmonary arteriography confirms the presence of significant thrombus in both left and right pulmonary arteries extending into lower lobe pulmonary artery branches. Thrombus in the right lower lobe pulmonary artery is nearly occlusive.  PLAN: The patient will return to IR tomorrow morning for repeat pressure measurements and removal of the catheters. After 12 hour infusion via each catheter, infusion will be converted to saline overnight.   Electronically Signed   By: Irish Lack M.D.   On: 08/29/2013 17:24   Ir Angiogram Selective Each Additional Vessel  08/29/2013   INDICATION: Submassive bilateral pulmonary embolism with right heart strain and pulmonary hypertension by echocardiography.  EXAM: 1. ULTRASOUND GUIDANCE FOR VENOUS ACCESS X 2 2. BILATERAL PULMONARY ARTERIOGRAPHY 3. FLUOROSCOPIC GUIDED PLACEMENT OF BILATERAL PULMONARY ARTERIAL LYTIC INFUSION CATHETERS 4. ULTRASOUND ASSISTED THROMBOLYTIC THERAPY OF LEFT PULMONARY ARTERY 5. ULTRASOUND ASSISTED THROMBOLYTIC THERAPY OF RIGHT PULMONARY ARTERY  COMPARISON:  CTA OF THE CHEST, 08/27/2013  MEDICATIONS: Versed 1.0 mg IV; Fentanyl 25 mcg IV  Sedation time:  CONTRAST:  50 mL Omnipaque 300  FLUOROSCOPY TIME:  11 minutes 24 seconds.  COMPLICATIONS: None immediate  TECHNIQUE: Informed written consent was obtained from the patient after a discussion of the risks, benefits and alternatives to treatment. Questions regarding the procedure were encouraged and answered.  The right groin was prepped and draped in the usual sterile fashion, and a sterile drape was applied covering the operative field. Maximum barrier sterile technique with sterile gowns and gloves were used for the procedure. A timeout was performed  prior to the initiation of the procedure. Local anesthesia was provided with 1% lidocaine.  Under direct ultrasound guidance, the right common femoral vein was accessed with a micro puncture set ultimately allowing placement of a 6 French vascular sheath. Adjacent to this initial access, the right common femoral vein was again accessed with a micropuncture sheath ultimately allowing placement of a 6 French vascular sheath.  With the use of a guidewire, a 5 French catheter was advanced into the right main pulmonary artery and a left pulmonary arteriogram was performed with hand injection. Arteriography was also performed at the level of the left lower lobe pulmonary artery. Pressure measurements were then obtained from the left pulmonary artery.  Over an exchange length Rosen wire, the pigtail catheter was exchanged for a 105/18 cm multi side-hole EKOS ultrasound assisted infusion catheter.  With the use of a guidewire, a 5 French catheter was advanced into the right main pulmonary artery. Pulmonary artery pressures were obtained. A pulmonary arteriogram was performed with hand injection. Arteriography was also performed at the level of the right lower lobe pulmonary artery.  Over an exchange length Rosen wire, the pigtail catheter was exchanged for a 105/12 cm multi side-hole EKOS ultrasound assisted infusion catheter.  A postprocedural fluoroscopic image was obtained to confirm final catheter positioning.  Both vascular sheath were secured at the right groin with interrupted 0 Prolene sutures. The external catheter tubing was secured and thrombolytic lytic therapy was initiated. Thrombolytic therapy was begun via the EKOS system with infusion of tPA at a dose of 1 mg/hr through each infusion catheter.  The patient tolerated the procedure well without immediate postprocedural complication.  FINDINGS: Pulmonary arteriography demonstrated significant nonocclusive thrombus in the distal left main pulmonary artery  extending into the lower lobe artery. There was nonocclusive thrombus in upper lobe branches. Right pulmonary arteriography demonstrates  significant thrombus in the distal right main pulmonary artery extending into the lower lobe pulmonary artery. Poor flow was noted in lower lobe branches and there was also lack of good visualization of upper lobe supply.  Pulmonary arterial pressure measurements:  Both main pulmonary artery segments demonstrated identical pressures of 51/22 mm Hg with mean pressure of 33 mm Hg.  Following the procedure, both ultrasound assisted infusion catheter tips terminate within the distal aspects of the bilateral lower lobe sub segmental pulmonary arteries.  IMPRESSION: 1. Successful initiation of bilateral ultrasound assisted catheter directed pulmonary arterial lysis for submassive pulmonary embolism and right-sided heart strain. Bilateral EKOS thrombolytic catheters were placed and advanced into lower lobe pulmonary artery branches. Infusion of tPA was begun through each catheter at a rate of 1 milligram/hour. 2. Markedly elevated pressure measurements within the right and left pulmonary arteries compatible with critical pulmonary arterial hypertension. 3. Pulmonary arteriography confirms the presence of significant thrombus in both left and right pulmonary arteries extending into lower lobe pulmonary artery branches. Thrombus in the right lower lobe pulmonary artery is nearly occlusive.  PLAN: The patient will return to IR tomorrow morning for repeat pressure measurements and removal of the catheters. After 12 hour infusion via each catheter, infusion will be converted to saline overnight.   Electronically Signed   By: Irish Lack M.D.   On: 08/29/2013 17:24   Ir US Guide Vasc Access Right  08/29/2013   INDICATION: Submassive bilateral pulmonary embolism with right heart strain and pulmonary hypertension by echocardiography.  EXAM: 1. ULTRASOUND GUIDANCE FOR VENOUS ACCESS X 2 2.  BILATERAL PULMONARY ARTERIOGRAPHY 3. FLUOROSCOPIC GUIDED PLACEMENT OF BILATERAL PULMONARY ARTERIAL LYTIC INFUSION CATHETERS 4. ULTRASOUND ASSISTED THROMBOLYTIC THERAPY OF LEFT PULMONARY ARTERY 5. ULTRASOUND ASSISTED THROMBOLYTIC THERAPY OF RIGHT PULMONARY ARTERY  COMPARISON:  CTA OF THE CHEST, 08/27/2013  MEDICATIONS: Versed 1.0 mg IV; Fentanyl 25 mcg IV  Sedation time:  CONTRAST:  50 mL Omnipaque 300  FLUOROSCOPY TIME:  11 minutes 24 seconds.  COMPLICATIONS: None immediate  TECHNIQUE: Informed written consent was obtained from the patient after a discussion of the risks, benefits and alternatives to treatment. Questions regarding the procedure were encouraged and answered.  The right groin was prepped and draped in the usual sterile fashion, and a sterile drape was applied covering the operative field. Maximum barrier sterile technique with sterile gowns and gloves were used for the procedure. A timeout was performed prior to the initiation of the procedure. Local anesthesia was provided with 1% lidocaine.  Under direct ultrasound guidance, the right common femoral vein was accessed with a micro puncture set ultimately allowing placement of a 6 French vascular sheath. Adjacent to this initial access, the right common femoral vein was again accessed with a micropuncture sheath ultimately allowing placement of a 6 French vascular sheath.  With the use of a guidewire, a 5 French catheter was advanced into the right main pulmonary artery and a left pulmonary arteriogram was performed with hand injection. Arteriography was also performed at the level of the left lower lobe pulmonary artery. Pressure measurements were then obtained from the left pulmonary artery.  Over an exchange length Rosen wire, the pigtail catheter was exchanged for a 105/18 cm multi side-hole EKOS ultrasound assisted infusion catheter.  With the use of a guidewire, a 5 French catheter was advanced into the right main pulmonary artery.  Pulmonary artery pressures were obtained. A pulmonary arteriogram was performed with hand injection. Arteriography was also performed at the level of  the right lower lobe pulmonary artery.  Over an exchange length Rosen wire, the pigtail catheter was exchanged for a 105/12 cm multi side-hole EKOS ultrasound assisted infusion catheter.  A postprocedural fluoroscopic image was obtained to confirm final catheter positioning.  Both vascular sheath were secured at the right groin with interrupted 0 Prolene sutures. The external catheter tubing was secured and thrombolytic lytic therapy was initiated. Thrombolytic therapy was begun via the EKOS system with infusion of tPA at a dose of 1 mg/hr through each infusion catheter.  The patient tolerated the procedure well without immediate postprocedural complication.  FINDINGS: Pulmonary arteriography demonstrated significant nonocclusive thrombus in the distal left main pulmonary artery extending into the lower lobe artery. There was nonocclusive thrombus in upper lobe branches. Right pulmonary arteriography demonstrates significant thrombus in the distal right main pulmonary artery extending into the lower lobe pulmonary artery. Poor flow was noted in lower lobe branches and there was also lack of good visualization of upper lobe supply.  Pulmonary arterial pressure measurements:  Both main pulmonary artery segments demonstrated identical pressures of 51/22 mm Hg with mean pressure of 33 mm Hg.  Following the procedure, both ultrasound assisted infusion catheter tips terminate within the distal aspects of the bilateral lower lobe sub segmental pulmonary arteries.  IMPRESSION: 1. Successful initiation of bilateral ultrasound assisted catheter directed pulmonary arterial lysis for submassive pulmonary embolism and right-sided heart strain. Bilateral EKOS thrombolytic catheters were placed and advanced into lower lobe pulmonary artery branches. Infusion of tPA was begun through  each catheter at a rate of 1 milligram/hour. 2. Markedly elevated pressure measurements within the right and left pulmonary arteries compatible with critical pulmonary arterial hypertension. 3. Pulmonary arteriography confirms the presence of significant thrombus in both left and right pulmonary arteries extending into lower lobe pulmonary artery branches. Thrombus in the right lower lobe pulmonary artery is nearly occlusive.  PLAN: The patient will return to IR tomorrow morning for repeat pressure measurements and removal of the catheters. After 12 hour infusion via each catheter, infusion will be converted to saline overnight.   Electronically Signed   By: Irish Lack M.D.   On: 08/29/2013 17:24   Dg Chest Port 1 View  08/28/2013   CLINICAL DATA:  Evaluate for atelectasis or infarct. Bilateral pulmonary emboli.  EXAM: PORTABLE CHEST - 1 VIEW  COMPARISON:  08/27/2013 CTA  FINDINGS: Midline trachea. Borderline cardiomegaly. Insert pleural Clear lungs.  IMPRESSION: Borderline cardiomegaly, without acute disease.   Electronically Signed   By: Jeronimo Greaves M.D.   On: 08/28/2013 08:10   Ir Infusion Thrombol Arterial Initial (ms)  08/29/2013   INDICATION: Submassive bilateral pulmonary embolism with right heart strain and pulmonary hypertension by echocardiography.  EXAM: 1. ULTRASOUND GUIDANCE FOR VENOUS ACCESS X 2 2. BILATERAL PULMONARY ARTERIOGRAPHY 3. FLUOROSCOPIC GUIDED PLACEMENT OF BILATERAL PULMONARY ARTERIAL LYTIC INFUSION CATHETERS 4. ULTRASOUND ASSISTED THROMBOLYTIC THERAPY OF LEFT PULMONARY ARTERY 5. ULTRASOUND ASSISTED THROMBOLYTIC THERAPY OF RIGHT PULMONARY ARTERY  COMPARISON:  CTA OF THE CHEST, 08/27/2013  MEDICATIONS: Versed 1.0 mg IV; Fentanyl 25 mcg IV  Sedation time:  CONTRAST:  50 mL Omnipaque 300  FLUOROSCOPY TIME:  11 minutes 24 seconds.  COMPLICATIONS: None immediate  TECHNIQUE: Informed written consent was obtained from the patient after a discussion of the risks, benefits and  alternatives to treatment. Questions regarding the procedure were encouraged and answered.  The right groin was prepped and draped in the usual sterile fashion, and a sterile drape was applied covering the operative  field. Maximum barrier sterile technique with sterile gowns and gloves were used for the procedure. A timeout was performed prior to the initiation of the procedure. Local anesthesia was provided with 1% lidocaine.  Under direct ultrasound guidance, the right common femoral vein was accessed with a micro puncture set ultimately allowing placement of a 6 French vascular sheath. Adjacent to this initial access, the right common femoral vein was again accessed with a micropuncture sheath ultimately allowing placement of a 6 French vascular sheath.  With the use of a guidewire, a 5 French catheter was advanced into the right main pulmonary artery and a left pulmonary arteriogram was performed with hand injection. Arteriography was also performed at the level of the left lower lobe pulmonary artery. Pressure measurements were then obtained from the left pulmonary artery.  Over an exchange length Rosen wire, the pigtail catheter was exchanged for a 105/18 cm multi side-hole EKOS ultrasound assisted infusion catheter.  With the use of a guidewire, a 5 French catheter was advanced into the right main pulmonary artery. Pulmonary artery pressures were obtained. A pulmonary arteriogram was performed with hand injection. Arteriography was also performed at the level of the right lower lobe pulmonary artery.  Over an exchange length Rosen wire, the pigtail catheter was exchanged for a 105/12 cm multi side-hole EKOS ultrasound assisted infusion catheter.  A postprocedural fluoroscopic image was obtained to confirm final catheter positioning.  Both vascular sheath were secured at the right groin with interrupted 0 Prolene sutures. The external catheter tubing was secured and thrombolytic lytic therapy was initiated.  Thrombolytic therapy was begun via the EKOS system with infusion of tPA at a dose of 1 mg/hr through each infusion catheter.  The patient tolerated the procedure well without immediate postprocedural complication.  FINDINGS: Pulmonary arteriography demonstrated significant nonocclusive thrombus in the distal left main pulmonary artery extending into the lower lobe artery. There was nonocclusive thrombus in upper lobe branches. Right pulmonary arteriography demonstrates significant thrombus in the distal right main pulmonary artery extending into the lower lobe pulmonary artery. Poor flow was noted in lower lobe branches and there was also lack of good visualization of upper lobe supply.  Pulmonary arterial pressure measurements:  Both main pulmonary artery segments demonstrated identical pressures of 51/22 mm Hg with mean pressure of 33 mm Hg.  Following the procedure, both ultrasound assisted infusion catheter tips terminate within the distal aspects of the bilateral lower lobe sub segmental pulmonary arteries.  IMPRESSION: 1. Successful initiation of bilateral ultrasound assisted catheter directed pulmonary arterial lysis for submassive pulmonary embolism and right-sided heart strain. Bilateral EKOS thrombolytic catheters were placed and advanced into lower lobe pulmonary artery branches. Infusion of tPA was begun through each catheter at a rate of 1 milligram/hour. 2. Markedly elevated pressure measurements within the right and left pulmonary arteries compatible with critical pulmonary arterial hypertension. 3. Pulmonary arteriography confirms the presence of significant thrombus in both left and right pulmonary arteries extending into lower lobe pulmonary artery branches. Thrombus in the right lower lobe pulmonary artery is nearly occlusive.  PLAN: The patient will return to IR tomorrow morning for repeat pressure measurements and removal of the catheters. After 12 hour infusion via each catheter, infusion will  be converted to saline overnight.   Electronically Signed   By: Irish Lack M.D.   On: 08/29/2013 17:24   Ir Infusion Thrombol Arterial Initial (ms)  08/29/2013   INDICATION: Submassive bilateral pulmonary embolism with right heart strain and pulmonary hypertension by echocardiography.  EXAM: 1. ULTRASOUND GUIDANCE FOR VENOUS ACCESS X 2 2. BILATERAL PULMONARY ARTERIOGRAPHY 3. FLUOROSCOPIC GUIDED PLACEMENT OF BILATERAL PULMONARY ARTERIAL LYTIC INFUSION CATHETERS 4. ULTRASOUND ASSISTED THROMBOLYTIC THERAPY OF LEFT PULMONARY ARTERY 5. ULTRASOUND ASSISTED THROMBOLYTIC THERAPY OF RIGHT PULMONARY ARTERY  COMPARISON:  CTA OF THE CHEST, 08/27/2013  MEDICATIONS: Versed 1.0 mg IV; Fentanyl 25 mcg IV  Sedation time:  CONTRAST:  50 mL Omnipaque 300  FLUOROSCOPY TIME:  11 minutes 24 seconds.  COMPLICATIONS: None immediate  TECHNIQUE: Informed written consent was obtained from the patient after a discussion of the risks, benefits and alternatives to treatment. Questions regarding the procedure were encouraged and answered.  The right groin was prepped and draped in the usual sterile fashion, and a sterile drape was applied covering the operative field. Maximum barrier sterile technique with sterile gowns and gloves were used for the procedure. A timeout was performed prior to the initiation of the procedure. Local anesthesia was provided with 1% lidocaine.  Under direct ultrasound guidance, the right common femoral vein was accessed with a micro puncture set ultimately allowing placement of a 6 French vascular sheath. Adjacent to this initial access, the right common femoral vein was again accessed with a micropuncture sheath ultimately allowing placement of a 6 French vascular sheath.  With the use of a guidewire, a 5 French catheter was advanced into the right main pulmonary artery and a left pulmonary arteriogram was performed with hand injection. Arteriography was also performed at the level of the left lower  lobe pulmonary artery. Pressure measurements were then obtained from the left pulmonary artery.  Over an exchange length Rosen wire, the pigtail catheter was exchanged for a 105/18 cm multi side-hole EKOS ultrasound assisted infusion catheter.  With the use of a guidewire, a 5 French catheter was advanced into the right main pulmonary artery. Pulmonary artery pressures were obtained. A pulmonary arteriogram was performed with hand injection. Arteriography was also performed at the level of the right lower lobe pulmonary artery.  Over an exchange length Rosen wire, the pigtail catheter was exchanged for a 105/12 cm multi side-hole EKOS ultrasound assisted infusion catheter.  A postprocedural fluoroscopic image was obtained to confirm final catheter positioning.  Both vascular sheath were secured at the right groin with interrupted 0 Prolene sutures. The external catheter tubing was secured and thrombolytic lytic therapy was initiated. Thrombolytic therapy was begun via the EKOS system with infusion of tPA at a dose of 1 mg/hr through each infusion catheter.  The patient tolerated the procedure well without immediate postprocedural complication.  FINDINGS: Pulmonary arteriography demonstrated significant nonocclusive thrombus in the distal left main pulmonary artery extending into the lower lobe artery. There was nonocclusive thrombus in upper lobe branches. Right pulmonary arteriography demonstrates significant thrombus in the distal right main pulmonary artery extending into the lower lobe pulmonary artery. Poor flow was noted in lower lobe branches and there was also lack of good visualization of upper lobe supply.  Pulmonary arterial pressure measurements:  Both main pulmonary artery segments demonstrated identical pressures of 51/22 mm Hg with mean pressure of 33 mm Hg.  Following the procedure, both ultrasound assisted infusion catheter tips terminate within the distal aspects of the bilateral lower lobe sub  segmental pulmonary arteries.  IMPRESSION: 1. Successful initiation of bilateral ultrasound assisted catheter directed pulmonary arterial lysis for submassive pulmonary embolism and right-sided heart strain. Bilateral EKOS thrombolytic catheters were placed and advanced into lower lobe pulmonary artery branches. Infusion of tPA was begun through each catheter  at a rate of 1 milligram/hour. 2. Markedly elevated pressure measurements within the right and left pulmonary arteries compatible with critical pulmonary arterial hypertension. 3. Pulmonary arteriography confirms the presence of significant thrombus in both left and right pulmonary arteries extending into lower lobe pulmonary artery branches. Thrombus in the right lower lobe pulmonary artery is nearly occlusive.  PLAN: The patient will return to IR tomorrow morning for repeat pressure measurements and removal of the catheters. After 12 hour infusion via each catheter, infusion will be converted to saline overnight.   Electronically Signed   By: Irish Lack M.D.   On: 08/29/2013 17:24   Ir Rande Lawman F/u Eval Art/ven Final Day (ms)  08/30/2013   CLINICAL DATA:  Status post pulmonary embolism lysis check  EXAM: IR THROMB F/U EVAL ART/VEN FINAL DAY  FLUOROSCOPY TIME:  24 seconds.  MEDICATIONS AND MEDICAL HISTORY: None  ANESTHESIA/SEDATION: Moderate sedation time: None minutes  CONTRAST:  None  PROCEDURE: The procedure, risks, benefits, and alternatives were explained to the patient. Questions regarding the procedure were encouraged and answered. The patient understands and consents to the procedure.  Fluoroscopic imaging was utilized to confirm stable position of the right and left pulmonary artery catheters. Pulmonary artery pressure measurements were obtained. Pressures were determined of the 32 over 12 mm Hg with a mean of 20 mm Hg for the right and left pulmonary artery. The catheters and sheath were removed and hemostasis was achieved with direct pressure.   FINDINGS: Stable position of the pulmonary artery catheters.  COMPLICATIONS: None  IMPRESSION: Successful pulmonary artery lysis. Post lysis mean arterial pressures were 20 mm Hg bilaterally.   Electronically Signed   By: Maryclare Bean M.D.   On: 08/30/2013 17:12    Micro Results      Recent Results (from the past 240 hour(s))  MRSA PCR SCREENING     Status: None   Collection Time    08/27/13  7:06 PM      Result Value Ref Range Status   MRSA by PCR NEGATIVE  NEGATIVE Final   Comment:            The GeneXpert MRSA Assay (FDA     approved for NASAL specimens     only), is one component of a     comprehensive MRSA colonization     surveillance program. It is not     intended to diagnose MRSA     infection nor to guide or     monitor treatment for     MRSA infections.       Today   Subjective:   Isaiah Payne today has no headache,no chest abdominal pain,no new weakness tingling or numbness, feels much better wants to go home today.   Objective:   Blood pressure 107/57, pulse 49, temperature 97.7 F (36.5 C), temperature source Oral, resp. rate 18, height 6' (1.829 m), weight 79.6 kg (175 lb 7.8 oz), SpO2 99.00%.   Intake/Output Summary (Last 24 hours) at 09/01/13 1108 Last data filed at 09/01/13 0730  Gross per 24 hour  Intake   1080 ml  Output   1860 ml  Net   -780 ml    Exam Awake Alert, Oriented x 3, No new F.N deficits, Normal affect [place].AT,PERRAL Supple Neck,No JVD, No cervical lymphadenopathy appriciated.  Symmetrical Chest wall movement, Good air movement bilaterally, CTAB RRR,No Gallops,Rubs or new Murmurs, No Parasternal Heave +ve B.Sounds, Abd Soft, Non tender, No organomegaly appriciated, No rebound -guarding or rigidity. No Cyanosis,  Clubbing or edema, No new Rash or bruise  Data Review   CBC w Diff: Lab Results  Component Value Date   WBC 4.3 09/01/2013   HGB 13.7 09/01/2013   HCT 40.9 09/01/2013   PLT 142* 09/01/2013   LYMPHOPCT 36 08/27/2013    MONOPCT 8 08/27/2013   EOSPCT 4 08/27/2013   BASOPCT 1 08/27/2013    CMP: Lab Results  Component Value Date   NA 138 08/30/2013   K 4.2 08/30/2013   CL 104 08/30/2013   CO2 22 08/30/2013   BUN 8 08/30/2013   CREATININE 0.84 08/30/2013   PROT 6.7 08/28/2013   ALBUMIN 3.3* 08/28/2013   BILITOT 1.6* 08/28/2013   ALKPHOS 88 08/28/2013   AST 23 08/28/2013   ALT 32 08/28/2013  .  PTT Lupus Anticoagulant 28.0 - 43.0 secs  200.0 (H)   PTTLA Confirmation <8.0 secs  11.1 (H)   PTTLA 4:1 Mix 28.0 - 43.0 secs  200.0 (H)   Drvvt <42.9 secs  34.7   Drvvt confirmation <1.15 Ratio  NOT APPL   dRVVT Incubated 1:1 Mix <42.9 secs  NOT APPL   Lupus Anticoagulant NOT DETECTED  DETECTED (A)       Total Time in preparing paper work, data evaluation and todays exam - 35 minutes  Susa RaringSINGH,PRASHANT K M.D on 09/01/2013 at 11:08 AM  [facility] Office  [phone]   **Disclaimer: This note may have been dictated with voice recognition software. Similar sounding words can inadvertently be transcribed and this note may contain transcription errors which may not have been corrected upon publication of note.**

## 2013-09-01 NOTE — Progress Notes (Signed)
ANTICOAGULATION CONSULT NOTE - Follow Up Consult  Pharmacy Consult for Heparin->Rivaroxaban Indication: saddle PE, bilateral DVT   No Known Allergies  Patient Measurements: Height: 6' (182.9 cm) Weight: 175 lb 7.8 oz (79.6 kg) IBW/kg (Calculated) : 77.6 Heparin Dosing Weight: 79.6 kg  Vital Signs: Temp: 97.7 F (36.5 C) (08/17 0424) Temp src: Oral (08/17 0424) BP: 107/57 mmHg (08/17 0424) Pulse Rate: 49 (08/17 0424)  Labs:  Recent Labs  08/30/13 0352 08/30/13 1042  08/30/13 2030 08/31/13 0510 09/01/13 0330  HGB 13.2 13.8  --   --  14.2 13.7  HCT 39.5 41.5  --   --  42.9 40.9  PLT 124* 120*  --   --  145* 142*  HEPARINUNFRC  --   --   < > 0.45 0.47 0.50  CREATININE 0.84  --   --   --   --   --   < > = values in this interval not displayed.  Estimated Creatinine Clearance: 130.9 ml/min (by C-G formula based on Cr of 0.84).  Assessment:    Heparin level therapeutic (0.50) today on 1350 units/hr.   Transitioned to Rivaroxaban this am.  Expected life-long anticoagulation.  Platelet count 142, about the same, just below normal range.   Discussed Rivaroxaban dosing with patient, as well as anticoagulant precautions.  Previously on Coumadin for a year.   Xarelto education kit given, which includes free 30-day trial offer.  Also provided Xarelto [facility] card for 12-month supply at no cost.  Goal of Therapy:  treatment-dose Xarelto Monitor platelets by anticoagulation protocol: Yes   Plan:   Xarelto 15 mg BID x 21 days begun this am.  To transition to Xarelto 20 mg daily with supper on 09/22/13.  Rasul Decola Donovan, RPh Pager: [phone] 09/01/2013,10:39 AM   

## 2013-09-01 NOTE — Discharge Instructions (Addendum)
Follow with Primary MD Darrow Bussing, MD in 7 days   Get CBC, CMP, 2 view Chest X ray checked  by Primary MD next visit.    Activity: As tolerated with Full fall precautions use walker/cane & assistance as needed   Disposition Home     Diet: Heart Healthy    For Heart failure patients - Check your Weight same time everyday, if you gain over 2 pounds, or you develop in leg swelling, experience more shortness of breath or chest pain, call your Primary MD immediately. Follow Cardiac Low Salt Diet and 1.8 lit/day fluid restriction.   On your next visit with her primary care physician please Get Medicines reviewed and adjusted.  Please request your Prim.MD to go over all Hospital Tests and Procedure/Radiological results at the follow up, please get all Hospital records sent to your Prim MD by signing hospital release before you go home.   If you experience worsening of your admission symptoms, develop shortness of breath, life threatening emergency, suicidal or homicidal thoughts you must seek medical attention immediately by calling 911 or calling your MD immediately  if symptoms less severe.  You Must read complete instructions/literature along with all the possible adverse reactions/side effects for all the Medicines you take and that have been prescribed to you. Take any new Medicines after you have completely understood and accpet all the possible adverse reactions/side effects.   Do not drive, operating heavy machinery, perform activities at heights, swimming or participation in water activities or provide baby sitting services if your were admitted for syncope or siezures until you have seen by Primary MD or a Neurologist and advised to do so again.  Do not drive when taking Pain medications.    Do not take more than prescribed Pain, Sleep and Anxiety Medications  Special Instructions: If you have smoked or chewed Tobacco  in the last 2 yrs please stop smoking, stop any regular  Alcohol  and or any Recreational drug use.  Wear Seat belts while driving.   Please note  You were cared for by a hospitalist during your hospital stay. If you have any questions about your discharge medications or the care you received while you were in the hospital after you are discharged, you can call the unit and asked to speak with the hospitalist on call if the hospitalist that took care of you is not available. Once you are discharged, your primary care physician will handle any further medical issues. Please note that NO REFILLS for any discharge medications will be authorized once you are discharged, as it is imperative that you return to your primary care physician (or establish a relationship with a primary care physician if you do not have one) for your aftercare needs so that they can reassess your need for medications and monitor your lab values.      Information on my medicine - XARELTO (rivaroxaban)  This medication education was reviewed with me or my healthcare representative as part of my discharge preparation.  The pharmacist that spoke with me during my hospital stay was:  Dennie Fetters, [facility]  WHY WAS Carlena Hurl PRESCRIBED FOR YOU? Xarelto was prescribed to treat blood clots that may have been found in the veins of your legs (deep vein thrombosis) or in your lungs (pulmonary embolism) and to reduce the risk of them occurring again.  What do you need to know about Xarelto? The starting dose is one 15 mg tablet taken TWICE daily with food for the FIRST  21 DAYS then on (enter date)  09/22/2013  the dose is changed to one 20 mg tablet taken ONCE A DAY with your evening meal.  DO NOT stop taking Xarelto without talking to the health care provider who prescribed the medication.  Refill your prescription for 20 mg tablets before you run out.  After discharge, you should have regular check-up appointments with your healthcare provider that is prescribing your Xarelto.   In the future your dose may need to be changed if your kidney function changes by a significant amount.  What do you do if you miss a dose? If you are taking Xarelto TWICE DAILY and you miss a dose, take it as soon as you remember. You may take two 15 mg tablets (total 30 mg) at the same time then resume your regularly scheduled 15 mg twice daily the next day.  If you are taking Xarelto ONCE DAILY and you miss a dose, take it as soon as you remember on the same day then continue your regularly scheduled once daily regimen the next day. Do not take two doses of Xarelto at the same time.   Important Safety Information Xarelto is a blood thinner medicine that can cause bleeding. You should call your healthcare provider right away if you experience any of the following:   Bleeding from an injury or your nose that does not stop.   Unusual colored urine (red or dark brown) or unusual colored stools (red or black).   Unusual bruising for unknown reasons.   A serious fall or if you hit your head (even if there is no bleeding).  Some medicines may interact with Xarelto and might increase your risk of bleeding while on Xarelto. To help avoid this, consult your healthcare provider or pharmacist prior to using any new prescription or non-prescription medications, including herbals, vitamins, non-steroidal anti-inflammatory drugs (NSAIDs) and supplements.  This website has more information on Xarelto: [email or web address].xarelto.[email or web address].

## 2013-09-01 NOTE — Progress Notes (Signed)
Agree.  Important parameter is the reduction in pulmonary MAP after treatment.  Will follow.

## 2013-09-01 NOTE — Progress Notes (Signed)
Echo Lab  2D Echocardiogram completed.  Kharisma Glasner L Genola Yuille, RDCS 09/01/2013 10:12 AM

## 2013-09-01 NOTE — Progress Notes (Signed)
Patient ID: Isaiah Payne, male   DOB: [birth year], 38 y.o.   MRN: [id number]   Called to pts room RN states that as pt got dressed to go home he removed dressing from Rt groin and  area started "oozing slightly". although she placed new bandage---each time she looked---continued to slightly ooze.  Asked RN to lay pt down and place pressure dressing. I now have evaluated site---has been laying flat x 1 hr. Site is clean and dry; no oozing. No hematoma New dressing applied  May dc home No lifting over 10 lbs x 24 hrs. Restful at home

## 2013-09-01 NOTE — Progress Notes (Signed)
Subjective: B PE lysis performed in IR 8/14-8/15 Pt had good result Feels better CT 8/17 am reveals significant reduction in B PE with persistent (but reduced) Rt heart strain  Objective: Vital signs in last 24 hours: Temp:  [97.7 F (36.5 C)-98.3 F (36.8 C)] 97.7 F (36.5 C) (08/17 0424) Pulse Rate:  [49-76] 49 (08/17 0424) Resp:  [18-19] 18 (08/17 0424) BP: (105-134)/(57-73) 107/57 mmHg (08/17 0424) SpO2:  [98 %-99 %] 99 % (08/17 0424) Last BM Date: 08/31/13  Intake/Output from previous day: 08/16 0701 - 08/17 0700 In: 960 [P.O.:960] Out: 2195 [Urine:2195] Intake/Output this shift:    PE:  Afeb; VSS Up in bed Breathing well; CTA R Groin site clean and dry; NT No hematoma Rt foot 2+ pulses   Lab Results:   Recent Labs  08/31/13 0510 09/01/13 0330  WBC 3.7* 4.3  HGB 14.2 13.7  HCT 42.9 40.9  PLT 145* 142*   BMET  Recent Labs  08/30/13 0352  NA 138  K 4.2  CL 104  CO2 22  GLUCOSE 90  BUN 8  CREATININE 0.84  CALCIUM 8.9   PT/INR No results found for this basename: LABPROT, INR,  in the last 72 hours ABG No results found for this basename: PHART, PCO2, PO2, HCO3,  in the last 72 hours  Studies/Results: Ct Angio Chest Pe W/cm &/or Wo Cm  09/01/2013   CLINICAL DATA:  Followup pulmonary embolism.  Status post lipase is.  EXAM: CT ANGIOGRAPHY CHEST WITH CONTRAST  TECHNIQUE: Multidetector CT imaging of the chest was performed using the standard protocol during bolus administration of intravenous contrast. Multiplanar CT image reconstructions and MIPs were obtained to evaluate the vascular anatomy.  CONTRAST:  OMNIPAQUE IOHEXOL 350 MG/ML SOLN  COMPARISON:  08/27/2013  FINDINGS: The chest wall is stable.  The heart is normal in size. No pericardial effusion. The there are persistent small pulmonary emboli bilaterally. Overall significant improved when compared to prior examination. Persistent right heart strain with flattening of the left ventricle.  The RV/LV ratio equals 1.12 (Previously 1.36).  Examination of the lung parenchyma demonstrates stable mild emphysematous changes. No acute pulmonary findings. No pulmonary infarction.  The aorta is normal.  Review of the MIP images confirms the above findings.  IMPRESSION: Significant reduction in pulmonary emboli when compared to CT scan of 08/27/2013.  Persistent right heart strain with RV/ LV ratio of 1.12.   Electronically Signed   By: Loralie Champagne M.D.   On: 09/01/2013 01:17   Ir Rande Lawman F/u Eval Art/ven Final Day (ms)  08/30/2013   CLINICAL DATA:  Status post pulmonary embolism lysis check  EXAM: IR THROMB F/U EVAL ART/VEN FINAL DAY  FLUOROSCOPY TIME:  24 seconds.  MEDICATIONS AND MEDICAL HISTORY: None  ANESTHESIA/SEDATION: Moderate sedation time: None minutes  CONTRAST:  None  PROCEDURE: The procedure, risks, benefits, and alternatives were explained to the patient. Questions regarding the procedure were encouraged and answered. The patient understands and consents to the procedure.  Fluoroscopic imaging was utilized to confirm stable position of the right and left pulmonary artery catheters. Pulmonary artery pressure measurements were obtained. Pressures were determined of the 32 over 12 mm Hg with a mean of 20 mm Hg for the right and left pulmonary artery. The catheters and sheath were removed and hemostasis was achieved with direct pressure.  FINDINGS: Stable position of the pulmonary artery catheters.  COMPLICATIONS: None  IMPRESSION: Successful pulmonary artery lysis. Post lysis mean arterial pressures were 20 mm Hg  bilaterally.   Electronically Signed   By: Maryclare BeanArt  Hoss M.D.   On: 08/30/2013 17:12    Anti-infectives: Anti-infectives   None      Assessment/Plan: s/p * No surgery found *  B PE lysis  Finished 8/15 CT 8/17 early am sig reduction in PE Rt heart strain improved May start Xarelto today Hopefully home today IR will follow in clinic   LOS: 5 days    Omar Orrego  A 09/01/2013

## 2013-09-01 NOTE — Progress Notes (Signed)
Agree 

## 2013-09-01 NOTE — Progress Notes (Signed)
09/01/2013 1:20 PM Nursing note During discharge process, dressing removed from right groin. Slight drop of blood noted to ooze from groin site at removal of dressing. Pressure applied total of 10 minutes and pressure dressing reapplied. Pt. Observed x 30 min. No new bleeding noted. Pt. Encouraged to ambulate and site re-assessed. Slight ooze yet again noted from site. Pressure applied another 5 min and another pressure dressing applied. IR PA Turpin paged and made aware. PA to see patient and inspect site per orders Dr. Thedore MinsSingh. Pt. Updated on plan of care. Will continue to closely monitor patient.  Hubert Derstine, Blanchard KelchStephanie Ingold

## 2013-09-02 ENCOUNTER — Other Ambulatory Visit (HOSPITAL_COMMUNITY): Payer: Self-pay | Admitting: Interventional Radiology

## 2013-09-02 DIAGNOSIS — Z86718 Personal history of other venous thrombosis and embolism: Secondary | ICD-10-CM

## 2013-09-02 DIAGNOSIS — I82409 Acute embolism and thrombosis of unspecified deep veins of unspecified lower extremity: Secondary | ICD-10-CM

## 2013-09-02 DIAGNOSIS — R9431 Abnormal electrocardiogram [ECG] [EKG]: Secondary | ICD-10-CM

## 2013-09-02 DIAGNOSIS — R0600 Dyspnea, unspecified: Secondary | ICD-10-CM

## 2013-09-02 DIAGNOSIS — I2699 Other pulmonary embolism without acute cor pulmonale: Secondary | ICD-10-CM

## 2013-09-03 ENCOUNTER — Encounter: Payer: Self-pay | Admitting: Emergency Medicine

## 2013-09-09 ENCOUNTER — Encounter: Payer: Self-pay | Admitting: Internal Medicine

## 2013-09-09 ENCOUNTER — Ambulatory Visit (INDEPENDENT_AMBULATORY_CARE_PROVIDER_SITE_OTHER): Payer: BC Managed Care – PPO | Admitting: Internal Medicine

## 2013-09-09 VITALS — BP 100/64 | HR 60 | Temp 98.6°F | Ht 72.0 in | Wt 182.0 lb

## 2013-09-09 DIAGNOSIS — Z86718 Personal history of other venous thrombosis and embolism: Secondary | ICD-10-CM

## 2013-09-09 DIAGNOSIS — R0609 Other forms of dyspnea: Secondary | ICD-10-CM

## 2013-09-09 DIAGNOSIS — R0989 Other specified symptoms and signs involving the circulatory and respiratory systems: Secondary | ICD-10-CM

## 2013-09-09 DIAGNOSIS — I2699 Other pulmonary embolism without acute cor pulmonale: Secondary | ICD-10-CM

## 2013-09-09 DIAGNOSIS — R0600 Dyspnea, unspecified: Secondary | ICD-10-CM

## 2013-09-09 MED ORDER — RIVAROXABAN 20 MG PO TABS: 20.0000 mg | ORAL_TABLET | Freq: Every day | ORAL | Status: DC

## 2013-09-09 NOTE — Patient Instructions (Addendum)
Continue your medications as they are but stop the protonix when it runs out and just take pepcid  To get the most out of exercise, you need to be continuously aware that you are short of breath, but never out of breath, for 30 minutes daily. As you improve, it will actually be easier for you to do the same amount of exercise  in  30 minutes so always push to the level where you are short of breath.   Please schedule a follow up visit in 3 months but call sooner if needed

## 2013-09-09 NOTE — Progress Notes (Signed)
Subjective:    Patient ID: Isaiah Payne, male    DOB: 13-Aug-1975,   MRN: [id number]  HPI  43 yomale neapalese some sinus problems c/w  seasonal rhinitis x decades with new onset doe Fall 2014 and w/u in [place] 07/2013 with ? PAH with nl D dimer there referred by Dr Docia Chuck to pulmonary clinic 08/26/13    08/26/2013 1st [facility] office visit/ Ambyr Qadri  Chief Complaint  Patient presents with  . Pulmonary Consult    Referred per Dr. Docia Chuck. Pt c/o SOB since Nov 2014. He states that he started noticing that he would get SOB while playing soccer. SOB has been progressively worse for the past several months.   sob p walking only after sits down. Only new complaint since onset of sob is sorethroat on L with variable HB not better on ppi x 2 weeks rec Pantoprazole (protonix) 40 mg   Take 30-60 min before first meal of the day and Pepcid 20 mg one bedtime until return to office Please see patient coordinator before you leave today  to schedule repeat 2d echo  Please remember to go to the lab  department downstairs for your tests - we will call you with the results when they are available. GERD diet Lab:  Pos Ddimer > CTa  08/27/13 admit to [facility] for multiple PE   Admission date: 08/27/2013 Admitting Physician Osvaldo Shipper, MD  Discharge Date: 09/01/2013  Primary MD Darrow Bussing, MD  Recommendations for primary care physician for things to follow:  Adjust xaralto dose after 3 weeks, patient needs outpatient hematology-pulmonary followup.  Family members to be checked for lupus anticoagulant  Admission Diagnosis Dyspnea [786.09]  Abnormal EKG [794.31]  Acute massive pulmonary embolism [415.19]  Discharge Diagnosis Dyspnea [786.09]  Abnormal EKG [794.31]  Acute massive pulmonary embolism [415.19]   History of DVT (deep vein thrombosis)  .  History of present illness and Hospital Course:  Kindly see H&P for history of present illness and admission details, please review complete Labs,  Consult reports and Test reports for all details in brief  HPI from the history and physical done on the day of admission  Isaiah Payne is a 38 y.o. 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 He tells me that he was in [place] recently and return from there about a week ago. This was a long flight. He also has been having cough with clear expectoration. Denies any blood in the sputum. Denies any chest pain whatsoever. No nausea, vomiting. For the last day or so he has noticed some dizziness and lightheadedness, but denies any syncopal episode. Denies any leg swelling. Interestingly when he was in [place] he went to see a doctor there and complained of shortness of breath. Dopplers were done, which were negative. No CT scan was done. And apparently, blood work was done, which suggested protein S deficiency.  Hospital Course  1. Acute massive pulmonary embolism with bilateral lower extremity DVT in a patient with lupus anticoagulant positive hypercoagulable state.  Much improved post TPA by IR on 08-29-13, seen by pulmonary critical care as well, pulmonary artery pressure certainly has improved and down from 53 to 30, has been transitioned to xaralto in the morning of 09-01-13 repeat CT and limited echogram shows much improvement in pulmonary artery pressure and right heart strain. We'll request PCP to arrange for outpatient hematology and pulmonary followup. Can consider repeating echogram  in one month. Kindly adjust xaralto dose in 21 days from now.  2. Mild pulmonary hypertension. As in #1 above. Improved after TPA and anticoagulation. Outpatient pulmonary followup. May repeat echo in one month.  3.GERD - continue PPI.  4. Mild thrombocytopenia secondary to consumption from massive clot burden. No acute issue monitor.  5. T wave changes on EKG. Chest pain-free, likely strain pattern due to massive PE and clot burden,  troponin 2 sets negative. Echo stable .  6. Positive lupus anticoagulant. [name] outpatient setting life long, outpatient oncology hematology followup. Requested to get blood relatives checked.      09/09/2013 f/u ov/Isaiah Payne re:  PE ? When first started  Chief Complaint  Patient presents with  . HFU    Pty reports his breathing is back to normal baseline. Denies any new co's today.    has not tried exercise yet but otherwise completely back to nl activity tol, no problem with bleeding   No   cough or cp or chest tightness, subjective wheeze overt sinus or hb symptoms. No unusual exp hx or h/o childhood pna/ asthma or knowledge of premature birth.  Sleeping ok without nocturnal  or early am exacerbation  of respiratory  c/o's or need for noct saba. Also denies any obvious fluctuation of symptoms with weather or environmental changes or other aggravating or alleviating factors except as outlined above   Current Medications, Allergies, Complete Past Medical History, Past Surgical History, Family History, and Social History were reviewed in [employer] record.  ROS  The following are not active complaints unless bolded sore throat, dysphagia, dental problems, itching, sneezing,  nasal congestion or excess/ purulent secretions, ear ache,   fever, chills, sweats, unintended wt loss, pleuritic or exertional cp, hemoptysis,  orthopnea pnd or leg swelling, presyncope, palpitations, heartburn, abdominal pain, anorexia, nausea, vomiting, diarrhea  or change in bowel or urinary habits, change in stools or urine, dysuria,hematuria,  rash, arthralgias, visual complaints, headache, numbness weakness or ataxia or problems with walking or coordination,  change in mood/affect or memory.                          Objective:   Physical Exam  Wt Readings from Last 3 Encounters:  08/27/13 175 lb 7.8 oz (79.6 kg)  08/26/13 178 lb (80.74 kg)  07/12/06 180 lb 1.6 oz (81.693 kg)      HEENT: nl dentition, turbinates, and orophanx. Nl external ear canals without cough reflex   NECK :  without JVD/Nodes/TM/ nl carotid upstrokes bilaterally   LUNGS: no acc muscle use, clear to A and P bilaterally without cough on insp or exp maneuvers   CV:  RRR  no s3 or murmur - slt  increase in P2, no edema   ABD:  soft and nontender with nl excursion in the supine position. No bruits or organomegaly, bowel sounds nl  MS:  warm without deformities, calf tenderness, cyanosis or clubbing  SKIN: warm and dry without lesions    NEURO:  alert, approp, no deficits           Assessment & Plan:

## 2013-09-12 ENCOUNTER — Ambulatory Visit: Payer: BC Managed Care – PPO | Admitting: Hematology and Oncology

## 2013-09-12 NOTE — Assessment & Plan Note (Signed)
-   08/26/2013  Walked RA x 3 laps @ 185 ft each stopped due to end of study, no tachycardia/ no sob or desat - CTa 08/27/2013 >> POS PE with RV Strain > to ER  - 09/12/2013  Walked RA x rapid pace x 3 laps @ 185 ft each stopped due to  End of study, no sats   Resolved, see PE ap

## 2013-09-12 NOTE — Assessment & Plan Note (Signed)
-   Admit 08/27/2013  rx trhombolytics 08/29/13 with PAS from 53 to 30 and no RV strain 09/01/13 - Venous dopplers 08/28/13 Findings consistent with deep vein thrombosis involving the right popliteal vein, right posterial tibial vein, right gastrocnemius vein, and left popliteal vein.  He is markedly improved with resolution of R ht strain and nl 02 sats with fast walking now and should be able to do anything he wants at sea level. He is going to [facility] soon and warned him avoid exertion there.  F/u q3 m, indefinite xarelto

## 2013-09-19 ENCOUNTER — Telehealth: Payer: Self-pay | Admitting: Hematology & Oncology

## 2013-09-19 NOTE — Telephone Encounter (Signed)
Called pt to schedule hospital follow up, pt said he is going to pcp in 2 weeks and would call if needed

## 2013-12-08 ENCOUNTER — Encounter: Payer: Self-pay | Admitting: Internal Medicine

## 2013-12-08 ENCOUNTER — Ambulatory Visit (INDEPENDENT_AMBULATORY_CARE_PROVIDER_SITE_OTHER): Payer: BC Managed Care – PPO | Admitting: Internal Medicine

## 2013-12-08 VITALS — BP 112/80 | HR 55 | Ht 72.0 in | Wt 189.0 lb

## 2013-12-08 DIAGNOSIS — I2699 Other pulmonary embolism without acute cor pulmonale: Secondary | ICD-10-CM

## 2013-12-08 MED ORDER — RIVAROXABAN 20 MG PO TABS: 20.0000 mg | ORAL_TABLET | Freq: Every day | ORAL | Status: DC

## 2013-12-08 NOTE — Patient Instructions (Signed)
No change in xarelto  We will see you back in 3 months to reassess at that point whether to continue the xarelto or not

## 2013-12-08 NOTE — Progress Notes (Signed)
Subjective:    Patient ID: Isaiah Payne, male    DOB: 13-Aug-1975,   MRN: [id number]  HPI  43 yomale [place] some sinus problems c/w  seasonal rhinitis x decades with new onset doe Fall 2014 and w/u in [place] 07/2013 with ? PAH with nl D dimer there referred by Dr Docia Chuck to pulmonary clinic 08/26/13    08/26/2013 1st [facility] office visit/ Ramonda Galyon  Chief Complaint  Patient presents with  . Pulmonary Consult    Referred per Dr. Docia Chuck. Pt c/o SOB since Nov 2014. He states that he started noticing that he would get SOB while playing soccer. SOB has been progressively worse for the past several months.   sob p walking only after sits down. Only new complaint since onset of sob is sorethroat on L with variable HB not better on ppi x 2 weeks rec Pantoprazole (protonix) 40 mg   Take 30-60 min before first meal of the day and Pepcid 20 mg one bedtime until return to office Please see patient coordinator before you leave today  to schedule repeat 2d echo  Please remember to go to the lab  department downstairs for your tests - we will call you with the results when they are available. GERD diet Lab:  Pos Ddimer > CTa  08/27/13 admit to [facility] for multiple PE   Admission date: 08/27/2013 Admitting Physician Osvaldo Shipper, MD  Discharge Date: 09/01/2013  Primary MD Darrow Bussing, MD  Recommendations for primary care physician for things to follow:  Adjust xaralto dose after 3 weeks, patient needs outpatient hematology-pulmonary followup.  Family members to be checked for lupus anticoagulant  Admission Diagnosis Dyspnea [786.09]  Abnormal EKG [794.31]  Acute massive pulmonary embolism [415.19]  Discharge Diagnosis Dyspnea [786.09]  Abnormal EKG [794.31]  Acute massive pulmonary embolism [415.19]   History of DVT (deep vein thrombosis)  .  History of present illness and Hospital Course:  Kindly see H&P for history of present illness and admission details, please review complete Labs,  Consult reports and Test reports for all details in brief  HPI from the history and physical done on the day of admission  Isaiah Payne is a 38 y.o. 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 He tells me that he was in [place] recently and return from there about a week ago. This was a long flight. He also has been having cough with clear expectoration. Denies any blood in the sputum. Denies any chest pain whatsoever. No nausea, vomiting. For the last day or so he has noticed some dizziness and lightheadedness, but denies any syncopal episode. Denies any leg swelling. Interestingly when he was in [place] he went to see a doctor there and complained of shortness of breath. Dopplers were done, which were negative. No CT scan was done. And apparently, blood work was done, which suggested protein S deficiency.  Hospital Course  1. Acute massive pulmonary embolism with bilateral lower extremity DVT in a patient with lupus anticoagulant positive hypercoagulable state.  Much improved post TPA by IR on 08-29-13, seen by pulmonary critical care as well, pulmonary artery pressure certainly has improved and down from 53 to 30, has been transitioned to xaralto in the morning of 09-01-13 repeat CT and limited echogram shows much improvement in pulmonary artery pressure and right heart strain. We'll request PCP to arrange for outpatient hematology and pulmonary followup. Can consider repeating echogram  in one month. Kindly adjust xaralto dose in 21 days from now.  2. Mild pulmonary hypertension. As in #1 above. Improved after TPA and anticoagulation. Outpatient pulmonary followup. May repeat echo in one month.  3.GERD - continue PPI.  4. Mild thrombocytopenia secondary to consumption from massive clot burden. No acute issue monitor.  5. T wave changes on EKG. Chest pain-free, likely strain pattern due to massive PE and clot burden,  troponin 2 sets negative. Echo stable .  6. Positive lupus anticoagulant. Lesia HausenXaralto outpatient setting life long, outpatient oncology hematology followup. Requested to get blood relatives checked.      09/09/2013 f/u ov/Lisaann Atha re:  PE ? When first started  Chief Complaint  Patient presents with  . HFU    Pty reports his breathing is back to normal baseline. Denies any new co's today.    has not tried exercise yet but otherwise completely back to nl activity tol, no problem with bleeding  rec Continue your medications as they are but stop the protonix when it runs out and just take pepcid To get the most out of exercise, you need to be continuously aware that you are short of breath, but never out of breath, for 30 minutes daily. As you improve, it will actually be easier for you to do the same amount of exercise  in  30 minutes so always push to the level where you are short of breath.    12/08/2013 f/u ov/Madisen Ludvigsen re:  Chief Complaint  Patient presents with  . Follow-up    Pt states doing well and denies any co's today.   playing soccer with min decrease ex tol vs baseline - plays 3 x weekly Some indigestion off pepcid No   cough or cp or chest tightness, subjective wheeze or overt sinus  symptoms. No unusual exp hx or h/o childhood pna/ asthma or knowledge of premature birth.  Sleeping ok without nocturnal  or early am exacerbation  of respiratory  c/o's or need for noct saba. Also denies any obvious fluctuation of symptoms with weather or environmental changes or other aggravating or alleviating factors except as outlined above   Current Medications, Allergies, Complete Past Medical History, Past Surgical History, Family History, and Social History were reviewed in [facility] electronic medical record.  ROS  The following are not active complaints unless bolded sore throat, dysphagia, dental problems, itching, sneezing,  nasal congestion or excess/ purulent secretions, ear ache,    fever, chills, sweats, unintended wt loss, pleuritic or exertional cp, hemoptysis,  orthopnea pnd or leg swelling, presyncope, palpitations, heartburn, abdominal pain, anorexia, nausea, vomiting, diarrhea  or change in bowel or urinary habits, change in stools or urine, dysuria,hematuria,  rash, arthralgias, visual complaints, headache, numbness weakness or ataxia or problems with walking or coordination,  change in mood/affect or memory.                          Objective:   Physical Exam   12/08/2013     189  Wt Readings from Last 3 Encounters:  08/27/13 175 lb 7.8 oz (79.6 kg)  08/26/13 178 lb (80.74 kg)  07/12/06 180 lb 1.6 oz (81.693 kg)     HEENT: nl dentition, turbinates, and orophanx. Nl external ear canals without cough reflex   NECK :  without JVD/Nodes/TM/ nl carotid upstrokes bilaterally   LUNGS: no acc muscle use, clear to A and P bilaterally without cough on insp or exp maneuvers  CV:  RRR  no s3 or murmur - slt  increase in P2, no edema   ABD:  soft and nontender with nl excursion in the supine position. No bruits or organomegaly, bowel sounds nl  MS:  warm without deformities, calf tenderness, cyanosis or clubbing  SKIN: warm and dry without lesions    NEURO:  alert, approp, no deficits           Assessment & Plan:

## 2013-12-11 NOTE — Assessment & Plan Note (Signed)
-   Admit 08/27/2013  rx trhombolytics 08/29/13 with PAS from 53 to 30 and no RV strain 09/01/13 - Venous dopplers 08/28/13 Findings consistent with deep vein thrombosis involving the right popliteal vein, right posterial tibial vein, right gastrocnemius vein, and left popliteal vein  Now on rx for 3 months and ex tol almost completely back to baseline > complete 3 more months of xarelto then regroup with echo and repeat venous dopplers

## 2013-12-23 ENCOUNTER — Other Ambulatory Visit: Payer: Self-pay | Admitting: Internal Medicine

## 2014-02-19 ENCOUNTER — Emergency Department (HOSPITAL_COMMUNITY)
Admission: EM | Admit: 2014-02-19 | Discharge: 2014-02-19 | Disposition: A | Discharge status: Home / Self Care | Payer: BLUE CROSS/BLUE SHIELD | Attending: Emergency Medicine | Admitting: Emergency Medicine

## 2014-02-19 ENCOUNTER — Emergency Department (HOSPITAL_COMMUNITY): Payer: BLUE CROSS/BLUE SHIELD

## 2014-02-19 ENCOUNTER — Encounter (HOSPITAL_COMMUNITY): Payer: Self-pay | Admitting: Emergency Medicine

## 2014-02-19 DIAGNOSIS — R0600 Dyspnea, unspecified: Secondary | ICD-10-CM

## 2014-02-19 DIAGNOSIS — R05 Cough: Secondary | ICD-10-CM | POA: Diagnosis present

## 2014-02-19 DIAGNOSIS — Z86711 Personal history of pulmonary embolism: Secondary | ICD-10-CM | POA: Insufficient documentation

## 2014-02-19 DIAGNOSIS — Z86718 Personal history of other venous thrombosis and embolism: Secondary | ICD-10-CM | POA: Diagnosis not present

## 2014-02-19 DIAGNOSIS — Z79899 Other long term (current) drug therapy: Secondary | ICD-10-CM | POA: Diagnosis not present

## 2014-02-19 HISTORY — DX: Other pulmonary embolism without acute cor pulmonale: I26.99

## 2014-02-19 LAB — BASIC METABOLIC PANEL
Anion gap: 5 (ref 5–15)
BUN: 9 mg/dL (ref 6–23)
CO2: 29 mmol/L (ref 19–32)
Calcium: 9.4 mg/dL (ref 8.4–10.5)
Chloride: 105 mmol/L (ref 96–112)
Creatinine, Ser: 1.12 mg/dL (ref 0.50–1.35)
GFR calc Af Amer: >90 mL/min (ref >90)
GFR calc non Af Amer: 82 mL/min — ABNORMAL LOW (ref >90)
Glucose, Bld: 105 mg/dL — ABNORMAL HIGH (ref 70–99)
Potassium: 3.9 mmol/L (ref 3.5–5.1)
Sodium: 139 mmol/L (ref 135–145)

## 2014-02-19 LAB — CBC WITH DIFFERENTIAL/PLATELET
Basophils Absolute: 0 10*3/uL (ref 0.0–0.1)
Basophils Relative: 0 % (ref 0–1)
Eosinophils Absolute: 0.2 10*3/uL (ref 0.0–0.7)
Eosinophils Relative: 3 % (ref 0–5)
HCT: 41.2 % (ref 39.0–52.0)
Hemoglobin: 14.6 g/dL (ref 13.0–17.0)
Lymphocytes Relative: 33 % (ref 12–46)
Lymphs Abs: 1.6 10*3/uL (ref 0.7–4.0)
MCH: 30 pg (ref 26.0–34.0)
MCHC: 35.4 g/dL (ref 30.0–36.0)
MCV: 84.8 fL (ref 78.0–100.0)
Monocytes Absolute: 0.3 10*3/uL (ref 0.1–1.0)
Monocytes Relative: 6 % (ref 3–12)
Neutro Abs: 2.8 10*3/uL (ref 1.7–7.7)
Neutrophils Relative %: 58 % (ref 43–77)
Platelets: 187 10*3/uL (ref 150–400)
RBC: 4.86 MIL/uL (ref 4.22–5.81)
RDW: 12.2 % (ref 11.5–15.5)
WBC: 4.9 10*3/uL (ref 4.0–10.5)

## 2014-02-19 LAB — I-STAT TROPONIN, ED: Troponin i, poc: 0 ng/mL (ref 0.00–0.08)

## 2014-02-19 MED ORDER — SODIUM CHLORIDE 0.9 % IV BOLUS (SEPSIS)
1000.0000 mL | Freq: Once | INTRAVENOUS | Status: AC
  Administered 2014-02-19: 1000 mL via INTRAVENOUS

## 2014-02-19 MED ORDER — IOHEXOL 350 MG/ML SOLN
80.0000 mL | Freq: Once | INTRAVENOUS | Status: AC | PRN
  Administered 2014-02-19: 80 mL via INTRAVENOUS

## 2014-02-19 NOTE — Discharge Instructions (Signed)
As discussed, it is important that you follow up tomorrow with your physician for continued management of your condition. ° °If you develop any new, or concerning changes in your condition, please return to the emergency department immediately. ° °

## 2014-02-19 NOTE — ED Notes (Signed)
Patient returned from CT

## 2014-02-19 NOTE — ED Notes (Signed)
Patient transported to CT 

## 2014-02-19 NOTE — ED Provider Notes (Signed)
CSN: [id number]     Arrival date & time 02/19/14  1403 History   First MD Initiated Contact with Patient 02/19/14 1604     Chief Complaint  Patient presents with  . Shortness of Breath    HPI  Patient presents with concern of ongoing chest pressure, bilateral neck fullness, dyspnea. This episode began about 6 days ago.  Since onset symptoms have been intermittent, seemingly with worsening dyspnea with exertion, but not with new chest pressure with exertion. There is no new edema, fever. There is a mild cough. Patient states that symptoms are very similar to those he experienced one year ago when he was diagnosed with pulmonary embolism. No definite etiology for his thromboembolic processes, though patient may have protein S deficiency. Patient has been taking Xarelto as prescribed.   Past Medical History  Diagnosis Date  . DVT (deep venous thrombosis) 2007    Left leg- txed with warfarin x 1 yr  . PE (pulmonary embolism)    History reviewed. No pertinent past surgical history. Family History  Problem Relation Age of Onset  . Transient ischemic attack Father    History  Substance Use Topics  . Smoking status: Never Smoker   . Smokeless tobacco: Never Used  . Alcohol Use: Yes     Comment: 1-2 beers daily    Review of Systems  Constitutional:       Per HPI, otherwise negative  HENT:       Per HPI, otherwise negative  Respiratory:       Per HPI, otherwise negative  Cardiovascular:       Per HPI, otherwise negative  Gastrointestinal: Negative for vomiting.  Endocrine:       Negative aside from HPI  Genitourinary:       Neg aside from HPI   Musculoskeletal:       Per HPI, otherwise negative  Skin: Negative.   Neurological: Negative for syncope.      Allergies  Review of patient's allergies indicates no known allergies.  Home Medications   Prior to Admission medications   Medication Sig Start Date End Date Taking? Authorizing Provider  XARELTO 20 MG TABS  tablet TAKE 1 TABLET BY MOUTH DAILY WITH SUPPER 12/23/13   Nyoka CowdenMichael B Wert, MD   BP 119/73 mmHg  Pulse 72  Temp(Src) 97.6 F (36.4 C) (Oral)  Resp 16  SpO2 99% Physical Exam  Constitutional: He is oriented to person, place, and time. He appears well-developed. No distress.  HENT:  Head: Normocephalic and atraumatic.  Eyes: Conjunctivae and EOM are normal.  Cardiovascular: Normal rate and regular rhythm.   Pulmonary/Chest: Effort normal. No stridor. No respiratory distress.  Abdominal: He exhibits no distension.  Musculoskeletal: He exhibits no edema.  Patient denies any new edematous changes in either lower extremity.  No gross deformities.  Neurological: He is alert and oriented to person, place, and time.  Skin: Skin is warm and dry.  Psychiatric: He has a normal mood and affect. Thought content normal.  Nursing note and vitals reviewed.   ED Course  Procedures (including critical care time) Labs Review Labs Reviewed  BASIC METABOLIC PANEL - Abnormal; Notable for the following:    Glucose, Bld 105 (*)    GFR calc non Af Amer 82 (*)    All other components within normal limits  CBC WITH DIFFERENTIAL/PLATELET  Rosezena SensorI-STAT TROPOININ, ED    Imaging Review Dg Chest 2 View  02/19/2014   CLINICAL DATA:  Shortness of breath.  History  of pulmonary emboli  EXAM: CHEST  2 VIEW  COMPARISON:  Chest CT angiogram September 01, 2013  FINDINGS: The lungs are clear. The heart size and pulmonary vascularity are normal. No adenopathy. No bone lesions.  IMPRESSION: No edema or consolidation.   Electronically Signed   By: Bretta Bang M.D.   On: 02/19/2014 15:37     EKG Interpretation   Date/Time:  Thursday February 19 2014 14:10:08 EST Ventricular Rate:  70 PR Interval:  154 QRS Duration: 92 QT Interval:  404 QTC Calculation: 436 R Axis:   76 Text Interpretation:  Normal sinus rhythm with sinus arrhythmia Normal ECG  Sinus rhythm Artifact Sinus arrhythmia Abnormal ekg Confirmed by  Gerhard Munch  MD ([id number]) on 02/19/2014 4:05:23 PM     Pulse oximetry 100% room air normal Cardiac 70 sinus rhythm normal After the initial evaluation I reviewed the patient's EMR.  6:38 PM On repeat exam the patient is in no distress. MDM   History presents with ongoing dyspnea.  Notably, the patient has a history of pulmonary embolism, as well as incompletely diagnosed coagulopathy. The patient's description of symptoms that were"the same"as those he experienced during his initial episode warranted CT scan, as the patient had elevated risk profile. CT scan demonstrated likely persistent/resolving clot burden, with no new evidence for pulmonary embolism. Patient has pulmonology follow-up tomorrow.  Low suspicion for other acute new pathology.  He was discharged in stable condition.    Gerhard Munch, MD 02/19/14 1840

## 2014-02-19 NOTE — ED Notes (Signed)
Pt here for SOB; pt on xerelto for PE; pt sts sx feel same with fullness in neck and SOB

## 2014-02-20 ENCOUNTER — Encounter: Payer: Self-pay | Admitting: Internal Medicine

## 2014-02-20 ENCOUNTER — Ambulatory Visit (INDEPENDENT_AMBULATORY_CARE_PROVIDER_SITE_OTHER): Payer: BLUE CROSS/BLUE SHIELD | Admitting: Internal Medicine

## 2014-02-20 VITALS — BP 104/60 | HR 67 | Temp 97.9°F | Ht 72.0 in | Wt 186.0 lb

## 2014-02-20 DIAGNOSIS — I2699 Other pulmonary embolism without acute cor pulmonale: Secondary | ICD-10-CM

## 2014-02-20 DIAGNOSIS — R0600 Dyspnea, unspecified: Secondary | ICD-10-CM

## 2014-02-20 MED ORDER — FAMOTIDINE 20 MG PO TABS: ORAL_TABLET | ORAL | Status: DC

## 2014-02-20 MED ORDER — PANTOPRAZOLE SODIUM 40 MG PO TBEC: 40.0000 mg | DELAYED_RELEASE_TABLET | Freq: Every day | ORAL | Status: DC

## 2014-02-20 NOTE — Patient Instructions (Addendum)
Pantoprazole (protonix) 40 mg  Take 30-60 min before first meal of the day and Pepcid 20 mg one bedtime until return to office - this is the best way to tell whether stomach acid is contributing to your problem.    GERD (REFLUX)  is an extremely common cause of respiratory symptoms just like yours , many times with no obvious heartburn at all.    It can be treated with medication, but also with lifestyle changes including avoidance of late meals, excessive alcohol, smoking cessation, and avoid fatty foods, chocolate, peppermint, colas, red wine, and acidic juices such as orange juice.  NO MINT OR MENTHOL PRODUCTS SO NO COUGH DROPS  USE SUGARLESS CANDY INSTEAD (Jolley ranchers or Stover's or Life Savers) or even ice chips will also do - the key is to swallow to prevent all throat clearing. NO OIL BASED VITAMINS - use powdered substitutes.    Please see patient coordinator before you leave today  to schedule cardiology consult  and echo hopefully same day

## 2014-02-20 NOTE — Assessment & Plan Note (Signed)
-   Admit 08/27/2013  rx trhombolytics 08/29/13 with PAS from 53   - Venous dopplers 08/28/13 Findings consistent with deep vein thrombosis involving the right popliteal vein, right posterial tibial vein, right gastrocnemius vein, and left popliteal vein. - Echo 09/01/13 PA pressures nl p thrombolytics  - CTa 02/19/14 cw webs over PA's   Note that he had atypical sob eval by [place] doctors with a nl D dimer before returning here with dx of acute PE with R H strain so the symptoms now may not be related but we need to make sure pressures are still nl and not having atypical angina though I strongly doubt both> rec repeat echo and cards eval

## 2014-02-20 NOTE — Assessment & Plan Note (Signed)
-   08/26/2013  Walked RA x 3 laps @ 185 ft each stopped due to end of study, no tachycardia/ no sob or desat - CTa 08/27/2013 >> POS PE with RV Strain > to ER  - 09/12/2013  Walked RA x rapid pace x 3 laps @ 185 ft each stopped due to  End of study, no sats   Symptoms are markedly disproportionate to objective findings and not clear this is a lung problem but pt does appear to have difficult airway management issues. DDX of  difficult airways management all start with A and  include Adherence, Ace Inhibitors, Acid Reflux, Active Sinus Disease, Alpha 1 Antitripsin deficiency, Anxiety masquerading as Airways dz,  ABPA,  allergy(esp in young), Aspiration (esp in elderly), Adverse effects of DPI,  Active smokers, plus two Bs  = Bronchiectasis and Beta blocker use..and one C= CHF  Adherence is always the initial "prime suspect" and is a multilayered concern that requires a "trust but verify" approach in every patient - starting with knowing how to use medications, especially inhalers, correctly, keeping up with refills and understanding the fundamental difference between maintenance and prns vs those medications only taken for a very short course and then stopped and not refilled.   ? Acid (or non-acid) GERD > always difficult to exclude as up to 75% of pts in some series report no assoc GI/ Heartburn symptoms> rec max (24h)  acid suppression and diet restrictions/ reviewed and instructions given in writing.   ? Anxiety > usually dx of exclusion but much higher on the list here  rec rx gerd first then cards eval/ repeat echo

## 2014-02-20 NOTE — Progress Notes (Signed)
Subjective:    Patient ID: Isaiah Payne, male    DOB: May 26, 1975,   MRN: [id number]    Brief patient profile:  39 yo male neapalese some sinus problems c/w  seasonal rhinitis x decades with new onset doe Fall 2014 and w/u in [place] 07/2013 with ? PAH with nl D dimer there referred by Dr Docia ChuckKoirala to pulmonary clinic 08/26/13 > dx PE  08/27/13 .     History of Present Illness  08/26/2013 1st [facility] office visit/ Wert  Chief Complaint  Patient presents with  . Pulmonary Consult    Referred per Dr. Docia ChuckKoirala. Pt c/o SOB since Nov 2014. He states that he started noticing that he would get SOB while playing soccer. SOB has been progressively worse for the past several months.   sob p walking only after sits down. Only new complaint since onset of sob is sorethroat on L with variable HB not better on ppi x 2 weeks rec Pantoprazole (protonix) 40 mg   Take 30-60 min before first meal of the day and Pepcid 20 mg one bedtime until return to office Please see patient coordinator before you leave today  to schedule repeat 2d echo  Please remember to go to the lab  department downstairs for your tests - we will call you with the results when they are available. GERD diet Lab:  Pos Ddimer > CTa  08/27/13 admit to [facility] for multiple PE   Admission date: 08/27/2013 Admitting Physician Osvaldo ShipperGokul Krishnan, MD  Discharge Date: 09/01/2013  Primary MD Darrow BussingKOIRALA,DIBAS, MD  Recommendations for primary care physician for things to follow:  Adjust xaralto dose after 3 weeks, patient needs outpatient hematology-pulmonary followup.  Family members to be checked for lupus anticoagulant  Admission Diagnosis Dyspnea [786.09]  Abnormal EKG [794.31]  Acute massive pulmonary embolism [415.19]  Discharge Diagnosis Dyspnea [786.09]  Abnormal EKG [794.31]  Acute massive pulmonary embolism [415.19]   History of DVT (deep vein thrombosis)  .  History of present illness and Hospital Course:  Kindly see H&P for  history of present illness and admission details, please review complete Labs, Consult reports and Test reports for all details in brief  HPI from the history and physical done on the day of admission  Isaiah Payne is a 39 y.o. 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 He tells me that he was in [place] recently and return from there about a week ago. This was a long flight. He also has been having cough with clear expectoration. Denies any blood in the sputum. Denies any chest pain whatsoever. No nausea, vomiting. For the last day or so he has noticed some dizziness and lightheadedness, but denies any syncopal episode. Denies any leg swelling. Interestingly when he was in [place] he went to see a doctor there and complained of shortness of breath. Dopplers were done, which were negative. No CT scan was done. And apparently, blood work was done, which suggested protein S deficiency.  Hospital Course  1. Acute massive pulmonary embolism with bilateral lower extremity DVT in a patient with lupus anticoagulant positive hypercoagulable state.  Much improved post TPA by IR on 08-29-13, seen by pulmonary critical care as well, pulmonary artery pressure certainly has improved and down from 53 to 30, has been transitioned to xaralto in the morning of 09-01-13 repeat CT and limited echogram shows much improvement in pulmonary artery pressure and right  heart strain. We'll request PCP to arrange for outpatient hematology and pulmonary followup. Can consider repeating echogram in one month. Kindly adjust xaralto dose in 21 days from now.  2. Mild pulmonary hypertension. As in #1 above. Improved after TPA and anticoagulation. Outpatient pulmonary followup. May repeat echo in one month.  3.GERD - continue PPI.  4. Mild thrombocytopenia secondary to consumption from massive clot burden. No acute issue monitor.  5. T wave changes on  EKG. Chest pain-free, likely strain pattern due to massive PE and clot burden, troponin 2 sets negative. Echo stable .  6. Positive lupus anticoagulant. [name] outpatient setting life long, outpatient oncology hematology followup. Requested to get blood relatives checked.      09/09/2013 f/u ov/Wert re:  PE ? When first started  Chief Complaint  Patient presents with  . HFU    Pty reports his breathing is back to normal baseline. Denies any new co's today.    has not tried exercise yet but otherwise completely back to nl activity tol, no problem with bleeding  rec Continue your medications as they are but stop the protonix when it runs out and just take pepcid To get the most out of exercise, you need to be continuously aware that you are short of breath, but never out of breath, for 30 minutes daily. As you improve, it will actually be easier for you to do the same amount of exercise  in  30 minutes so always push to the level where you are short of breath.    12/08/2013 f/u ov/Wert re:  Chief Complaint  Patient presents with  . Follow-up    Pt states doing well and denies any co's today.   playing soccer with min decrease ex tol vs baseline - plays 3 x weekly Some indigestion off pepcid rec No change in xarelto We will see you back in 3 month   02/20/2014 f/u ov/Wert re: sob p PE  Chief Complaint  Patient presents with  . Follow-up    Pt c/o increased SOB for the past wk- with or without exertion.   sob/ chest discomfort intermittent, always at rest, gradully more severe and frequent worse starting in Nov 2015 but still able to play soccer this week  with most of his symptoms never during  exertion and able to play for up to 2 h but then afterwards feels pain radiating neck no nausea/ diaphoresis/ presyncope  No obvious day to day or daytime variabilty or assoc chronic cough or  chest tightness, subjective wheeze overt sinus or hb symptoms. No unusual exp hx or h/o childhood pna/  asthma or knowledge of premature birth.  Sleeping ok without nocturnal  or early am exacerbation  of respiratory  c/o's or need for noct saba. Also denies any obvious fluctuation of symptoms with weather or environmental changes or other aggravating or alleviating factors except as outlined above   Current Medications, Allergies, Complete Past Medical History, Past Surgical History, Family History, and Social History were reviewed in [employer] record.  ROS  The following are not active complaints unless bolded sore throat, dysphagia, dental problems, itching, sneezing,  nasal congestion or excess/ purulent secretions, ear ache,   fever, chills, sweats, unintended wt loss, pleuritic or exertional cp, hemoptysis,  orthopnea pnd or leg swelling, presyncope, palpitations, heartburn, abdominal pain, anorexia, nausea, vomiting, diarrhea  or change in bowel or urinary habits, change in stools or urine, dysuria,hematuria,  rash, arthralgias, visual complaints, headache, numbness weakness or  ataxia or problems with walking or coordination,  change in mood/affect or memory.                       Objective:   Physical Exam   12/08/2013     189 > 02/20/2014 186 Wt Readings from Last 3 Encounters:  08/27/13 175 lb 7.8 oz (79.6 kg)  08/26/13 178 lb (80.74 kg)  07/12/06 180 lb 1.6 oz (81.693 kg)     HEENT: nl dentition, turbinates, and orophanx. Nl external ear canals without cough reflex   NECK :  without JVD/Nodes/TM/ nl carotid upstrokes bilaterally   LUNGS: no acc muscle use, clear to A and P bilaterally without cough on insp or exp maneuvers   CV:  RRR  no s3 or murmur - No  increase in P2, no edema   ABD:  soft and nontender with nl excursion in the supine position. No bruits or organomegaly, bowel sounds nl  MS:  warm without deformities, calf tenderness, cyanosis or clubbing  SKIN: warm and dry without lesions    NEURO:  alert, approp, no  deficits   CXR:  02/19/14  I personally reviewed images and agree with radiology impression as follows:    1. Residual web within the pulmonary arteries to the lower lobes bilaterally, with minimal filling defect to the anterior aspect of the left lower lobe. Given the large clot burden on the prior CTA, this is thought to reflect residual chronic clot. No new pulmonary embolus seen. 2. Minimal left basilar atelectasis noted; lungs otherwise clear. 3. Stable prominence of a 1.4 cm right hilar node, nonspecific in appearance.            Assessment & Plan:

## 2014-02-25 ENCOUNTER — Ambulatory Visit (HOSPITAL_COMMUNITY): Payer: BLUE CROSS/BLUE SHIELD | Attending: Cardiology | Admitting: Cardiology

## 2014-02-25 DIAGNOSIS — R0600 Dyspnea, unspecified: Secondary | ICD-10-CM | POA: Insufficient documentation

## 2014-02-25 DIAGNOSIS — I2699 Other pulmonary embolism without acute cor pulmonale: Secondary | ICD-10-CM | POA: Diagnosis present

## 2014-02-25 NOTE — Progress Notes (Signed)
Echo performed. 

## 2014-02-26 NOTE — Progress Notes (Signed)
Quick Note:  Spoke with pt and notified of results per Dr. Wert. Pt verbalized understanding and denied any questions.  ______ 

## 2014-03-12 ENCOUNTER — Ambulatory Visit (INDEPENDENT_AMBULATORY_CARE_PROVIDER_SITE_OTHER): Payer: BLUE CROSS/BLUE SHIELD | Admitting: Cardiovascular Disease

## 2014-03-12 ENCOUNTER — Encounter: Payer: Self-pay | Admitting: Cardiovascular Disease

## 2014-03-12 VITALS — BP 108/80 | HR 61 | Ht 72.0 in | Wt 186.1 lb

## 2014-03-12 DIAGNOSIS — I2782 Chronic pulmonary embolism: Secondary | ICD-10-CM

## 2014-03-12 DIAGNOSIS — D6859 Other primary thrombophilia: Secondary | ICD-10-CM

## 2014-03-12 NOTE — Progress Notes (Signed)
Cardiology Office Note   Date:  03/12/2014   ID:  Isaiah Payne, DOB August 12, 1975, MRN [id number]  PCP:  Darrow BussingKOIRALA,DIBAS, MD  Cardiologist:   Vesta MixerNahser, Marigny Borre J, MD   Chief Complaint  Patient presents with  . Chest Pain   1. 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 Massive bilateral pulmonary emboli  3. GERD    History of Present Illness: Isaiah Payne is a 39 y.o. male who presents for further eval of some ayptical CP. Marland Kitchen. He has been told that he had GERD.   He has had progressive DOE for years   He was diagnosed with PE 6 months ago.    He originally presented with severe shortness of breath.   CT scan performed on August 12 revealed bilateral saddle  pulmonary emboli. He was instructed to go to the emergency room. Echocardiogram revealed PA pressures in the 55 range. He received TPA  And felt better within hours.    Has been on Xarelto.  He plays soccer 3 times a week and is breathing very well.    On Feb. 4, 2016, he has occasional episodes of pressure on both sides of his neck, associated with lots of phlegm and congestion. Work up at the ER was normal. Follow up CT scan showed no recurrent PE and follow up echo revealed normal LV and RV size and pressure.  He had a work up in [place] which suggested a  Protein S deficiency.  When he was diagnosed with the PE here, the protein S levels were reported normal  There was some discussion about him stopping the Xarelto after some period of time.    Past Medical History  Diagnosis Date  . DVT (deep venous thrombosis) 2007    Left leg- txed with warfarin x 1 yr  . PE (pulmonary embolism)     No past surgical history on file.   Current Outpatient Prescriptions  Medication Sig Dispense Refill  . famotidine (PEPCID) 20 MG tablet One at bedtime 30 tablet 2  . XARELTO 20 MG TABS tablet TAKE 1 TABLET BY MOUTH DAILY WITH SUPPER 30 tablet 2   No current facility-administered medications for this visit.     Allergies:   Review of patient's allergies indicates no known allergies.    Social History:  The patient  reports that he has never smoked. He has never used smokeless tobacco. He reports that he drinks alcohol. He reports that he does not use illicit drugs.   Family History:  The patient's family history includes Transient ischemic attack in his father.    ROS:  Please see the history of present illness.    Review of Systems: Constitutional:  denies fever, chills, diaphoresis, appetite change and fatigue.  HEENT: denies photophobia, eye pain, redness, hearing loss, ear pain, congestion, sore throat, rhinorrhea, sneezing, neck pain, neck stiffness and tinnitus.  Respiratory: admits to SOB, DOE, cough,   Cardiovascular: denies chest pain, palpitations and leg swelling.  Gastrointestinal: denies nausea, vomiting, abdominal pain, diarrhea, constipation, blood in stool.  Genitourinary: denies dysuria, urgency, frequency, hematuria, flank pain and difficulty urinating.  Musculoskeletal: denies  myalgias, back pain, joint swelling, arthralgias and gait problem.   Skin: denies pallor, rash and wound.  Neurological: denies dizziness, seizures, syncope, weakness, light-headedness, numbness and headaches.   Hematological: denies adenopathy, easy bruising, personal or family bleeding history.  Psychiatric/ Behavioral: denies suicidal ideation, mood changes, confusion, nervousness, sleep disturbance and agitation.       All  other systems are reviewed and negative.    PHYSICAL EXAM: VS:  BP 108/80 mmHg  Pulse 61  Ht 6' (1.829 m)  Wt 186 lb 1.9 oz (84.423 kg)  BMI 25.24 kg/m2  SpO2 99% , BMI Body mass index is 25.24 kg/(m^2). GEN: Well nourished, well developed, in no acute distress HEENT: normal Neck: no JVD, carotid bruits, or masses Cardiac: RRR; no murmurs, rubs, or gallops,no edema  Respiratory:  clear to auscultation bilaterally, normal work of breathing GI: soft, nontender,  nondistended, + BS MS: no deformity or atrophy Skin: warm and dry, no rash Neuro:  Strength and sensation are intact Psych: normal   EKG:  EKG is not ordered today. The ekg ordered today demonstrates    Recent Labs: 08/26/2013: TSH 1.32 08/27/2013: Pro B Natriuretic peptide (BNP) 485.8* 08/28/2013: ALT 32 02/19/2014: BUN 9; Creatinine 1.12; Hemoglobin 14.6; Platelets 187; Potassium 3.9; Sodium 139    Lipid Panel No results found for: CHOL, TRIG, HDL, CHOLHDL, VLDL, LDLCALC, LDLDIRECT    Wt Readings from Last 3 Encounters:  03/12/14 186 lb 1.9 oz (84.423 kg)  02/20/14 186 lb (84.369 kg)  12/08/13 189 lb (85.73 kg)      Other studies Reviewed: Additional studies/ records that were reviewed today include: . Review of the above records demonstrates:    ASSESSMENT AND PLAN:  1.  Bilateral pulmonary emboli: The patient has a history of hyper coag state. His son have a protein S deficiency while in [place]. Apparently repeat results here did not show the same results.  I would like for him to see Dr. Cyndie Chime for a hematology evaluation. I have told him that I think that he needs to be on the Xarelto lifelong but I would leave the final decision up to Dr. Waynette Buttery advised him to have his children checked  For clotting disorders   His symptoms of cough and phlegm production after running on the soccer field does not sound cardiac at all. I think that this is still probably pulmonary issue. I do not think that he needs any additional cardiac evaluation at this time.  I'll see him again in one year for follow-up evaluation. I'll be happy to see him sooner if he has any questions.  Current medicines are reviewed at length with the patient today.  The patient does not have concerns regarding medicines.  The following changes have been made:  no change   Disposition:   FU with me in 1 year.     Signed, Rees Santistevan, Deloris Ping, MD  03/12/2014 4:38 PM    [facility]  [facility] [address] [place], [place], [place]  [address] Phone: [phone]; Fax: [phone]

## 2014-03-12 NOTE — Patient Instructions (Addendum)
You have been referred to  Hemotologist - Mervin KungJames Grandfortuna, MD  [phone]  Your physician recommends that you continue on your current medications as directed. Please refer to the Current Medication list given to you today.  Your physician wants you to follow-up in: 1 year with Dr. Elease HashimotoNahser.  You will receive a reminder letter in the mail two months in advance. If you don't receive a letter, please call our office to schedule the follow-up appointment.

## 2014-03-17 ENCOUNTER — Telehealth: Payer: Self-pay | Admitting: Oncology

## 2014-03-17 NOTE — Telephone Encounter (Signed)
FAXED REFERRAL FROM [facility] TO Isaiah Payne

## 2014-03-27 ENCOUNTER — Other Ambulatory Visit: Payer: Self-pay | Admitting: Internal Medicine

## 2014-05-13 ENCOUNTER — Telehealth: Payer: Self-pay | Admitting: Hematology & Oncology

## 2014-05-13 NOTE — Telephone Encounter (Signed)
I spoke w NEW PATIENT today to remind them of their appointment with Dr. Ennever. Also, advised them to bring all medication bottles and insurance card information. ° °

## 2014-05-14 ENCOUNTER — Ambulatory Visit (HOSPITAL_BASED_OUTPATIENT_CLINIC_OR_DEPARTMENT_OTHER): Payer: BLUE CROSS/BLUE SHIELD | Admitting: Family

## 2014-05-14 ENCOUNTER — Encounter: Payer: Self-pay | Admitting: Family

## 2014-05-14 ENCOUNTER — Ambulatory Visit: Payer: BLUE CROSS/BLUE SHIELD

## 2014-05-14 ENCOUNTER — Other Ambulatory Visit: Payer: Self-pay | Admitting: Family

## 2014-05-14 ENCOUNTER — Other Ambulatory Visit (HOSPITAL_BASED_OUTPATIENT_CLINIC_OR_DEPARTMENT_OTHER): Payer: BLUE CROSS/BLUE SHIELD

## 2014-05-14 VITALS — BP 108/65 | HR 50 | Resp 18 | Ht 72.0 in | Wt 186.0 lb

## 2014-05-14 DIAGNOSIS — I2699 Other pulmonary embolism without acute cor pulmonale: Secondary | ICD-10-CM

## 2014-05-14 DIAGNOSIS — I82403 Acute embolism and thrombosis of unspecified deep veins of lower extremity, bilateral: Secondary | ICD-10-CM

## 2014-05-14 DIAGNOSIS — Z86718 Personal history of other venous thrombosis and embolism: Secondary | ICD-10-CM

## 2014-05-14 NOTE — Progress Notes (Signed)
Hematology/Oncology Consultation   Name: Isaiah ReapRoshan Payne      MRN: [id number]    Location: Room/bed info not found  Date: 05/14/2014 Time:11:54 AM   REFERRING PHYSICIAN: Vesta MixerPhilip J. Nahser, MD  REASON FOR CONSULT: Bilateral pulmonary emboli, Xarelto management.    DIAGNOSIS:  Bilateral pulmonary emboli Hx of DVT in left lower leg  HISTORY OF PRESENT ILLNESS: Mr. Isaiah Payne is a very pleasant 39 yo male with bilateral pulmonary emboli and a history of DVT in the left leg.  He is originally from [place] and was returning from a visit there when he developed SOB and was diagnosed with the bilateral PEs. He was initially treated with TPA. He has been on Xarelto since August 2015. His CT angio in February of this year showed no new PE's but some residual chronic clots. He also has a 1.4 cm right hilar node that was thought to be nonspecific in appearance. His lab work up in August was negative. He was diagnosed with a DVT in his left lower leg in 2007 and treated with one year of Coumadin. He has some swelling and discomfort in that leg at times. 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   He has no family history of DVTs or PEs. No personal or familial history of cancer.  He does not smoke or drink alcohol.  He came to the [place] in 1997 for college, married and decided to make [place] his home. He works as an Airline pilotaccountant.  He denies fever, chills, n/v, cough, rash, dizziness, headache, blurred vision, chest pain, palpitations, abdominal pain, constipation, diarrhea, blood in urine or stool. No episodes of bruising or bleeding. He has had some SOB when exposed to cold.  He is very active and still playing soccer. His appetite is good and he is staying well hydrated.  No numbness or tingling in his extremities. No new aches or pains.   ROS: All other 10 point review of systems is negative.   PAST MEDICAL HISTORY:   Past Medical History  Diagnosis  Date  . DVT (deep venous thrombosis) 2007    Left leg- txed with warfarin x 1 yr  . PE (pulmonary embolism)     ALLERGIES: No Known Allergies    MEDICATIONS:  Current Outpatient Prescriptions on File Prior to Visit  Medication Sig Dispense Refill  . XARELTO 20 MG TABS tablet TAKE 1 TABLET BY MOUTH DAILY WITH SUPPER 30 tablet 2   No current facility-administered medications on file prior to visit.     PAST SURGICAL HISTORY No past surgical history on file.  FAMILY HISTORY: Family History  Problem Relation Age of Onset  . Transient ischemic attack Father     SOCIAL HISTORY:  reports that he has never smoked. He has never used smokeless tobacco. He reports that he drinks alcohol. He reports that he does not use illicit drugs.  PERFORMANCE STATUS: The patient's performance status is 1 - Symptomatic but completely ambulatory  PHYSICAL EXAM: Most Recent Vital Signs: Blood pressure 108/65, pulse 50, resp. rate 18, height 6' (1.829 m), weight 186 lb (84.369 kg). BP 108/65 mmHg  Pulse 50  Resp 18  Ht 6' (1.829 m)  Wt 186 lb (84.369 kg)  BMI 25.22 kg/m2  General Appearance:    Alert, cooperative, no distress, appears stated age  Head:    Normocephalic, without obvious abnormality, atraumatic  Eyes:    PERRL, conjunctiva/corneas clear, EOM's intact, fundi  benign, both eyes             Throat:   Lips, mucosa, and tongue normal; teeth and gums normal  Neck:   Supple, symmetrical, trachea midline, no adenopathy;       thyroid:  No enlargement/tenderness/nodules; no carotid   bruit or JVD  Back:     Symmetric, no curvature, ROM normal, no CVA tenderness  Lungs:     Clear to auscultation bilaterally, respirations unlabored  Chest wall:    No tenderness or deformity  Heart:    Regular rate and rhythm, S1 and S2 normal, no murmur, rub   or gallop  Abdomen:     Soft, non-tender, bowel sounds active all four quadrants,    no masses, no organomegaly        Extremities:    Extremities normal, atraumatic, no cyanosis or edema  Pulses:   2+ and symmetric all extremities  Skin:   Skin color, texture, turgor normal, no rashes or lesions  Lymph nodes:   Cervical, supraclavicular, and axillary nodes normal  Neurologic:   CNII-XII intact. Normal strength, sensation and reflexes      throughout   LABORATORY DATA:  No results found for this or any previous visit (from the past 48 hour(s)).    RADIOGRAPHY: No results found.     PATHOLOGY: None  ASSESSMENT/PLAN: Mr. [name] is a very pleasant 39 yo male with bilateral pulmonary emboli and a history of DVT in the left leg in 2007 (treated with 1 yr coumadin). He is doing well on Xarelto and having only a little SOB when exposed to cold.  He will be treated with 2 years of full anticoagulation and then we will re-evaluate and discuss reducing his dose.  We will see what his hypercoag panel shows.  He will wear his compression stocking to prevent post phlebitic pain and swelling.  We will plan to see him back in 4 months for follow-up.  All questions were answered. He knows to call the clinic with any problems, questions or concerns. We can certainly see him much sooner if necessary.  The patient was discussed with and also seen by Dr. Myna Hidalgo and he is in agreement with the aforementioned.   [facility]    Addendum:  I saw and examined the patient with Sarah. It would be unusual for him to have a hereditary coagulation problem given his ethnicity.  He did have hypercoagulable studies done. We will have to see what they show.  I would hate to commit him to lifelong anticoagulation.  He would need at least 2 years of therapeutic anticoagulation with Xarelto. If he does have a hypercoagulable issue, then I think he will need lifelong anticoagulation.  If we find that he has a normal hypercoagulable profile, then maybe we might be able to put him on low-dose Xarelto at the end of the 2 year therapeutic  course.  We spent about 45 [place] with him. We answered his questions. He is a real nice man.

## 2014-05-18 LAB — HYPERCOAGULABLE PANEL, COMPREHENSIVE
AntiThromb III Func: 106 % (ref 76–126)
Anticardiolipin IgA: 0 APL U/mL (ref <22)
Anticardiolipin IgG: 4 GPL U/mL (ref <23)
Anticardiolipin IgM: 3 MPL U/mL (ref <11)
Beta-2 Glyco I IgG: 2 G Units (ref <20)
Beta-2-Glycoprotein I IgA: 19 A Units (ref <20)
Beta-2-Glycoprotein I IgM: 4 M Units (ref <20)
DRVVT 1:1 Mix: 42.8 secs (ref <42.9)
DRVVT: 48.2 secs — ABNORMAL HIGH (ref <42.9)
Lupus Anticoagulant: NOT DETECTED
PTT Lupus Anticoagulant: 43.4 secs — ABNORMAL HIGH (ref 28.0–43.0)
PTTLA 4:1 Mix: 41.8 secs (ref 28.0–43.0)
Protein C Activity: 125 % (ref 75–133)
Protein C, Total: 87 % (ref 72–160)
Protein S Activity: 105 % (ref 69–129)
Protein S Total: 97 % (ref 60–150)

## 2014-06-22 ENCOUNTER — Encounter: Payer: BLUE CROSS/BLUE SHIELD | Admitting: Oncology

## 2014-06-28 ENCOUNTER — Other Ambulatory Visit: Payer: Self-pay | Admitting: Internal Medicine

## 2014-09-09 ENCOUNTER — Other Ambulatory Visit: Payer: Self-pay | Admitting: *Deleted

## 2014-09-10 ENCOUNTER — Ambulatory Visit: Payer: BLUE CROSS/BLUE SHIELD | Admitting: Hematology & Oncology

## 2014-09-10 ENCOUNTER — Other Ambulatory Visit: Payer: BLUE CROSS/BLUE SHIELD

## 2014-10-05 ENCOUNTER — Other Ambulatory Visit: Payer: Self-pay | Admitting: Internal Medicine

## 2014-11-09 ENCOUNTER — Other Ambulatory Visit: Payer: Self-pay | Admitting: Internal Medicine

## 2014-11-09 MED ORDER — RIVAROXABAN 20 MG PO TABS: 20.0000 mg | ORAL_TABLET | Freq: Every day | ORAL | Status: AC

## 2014-12-14 ENCOUNTER — Other Ambulatory Visit: Payer: Self-pay | Admitting: Internal Medicine

## 2015-04-25 IMAGING — CT CT ANGIO CHEST
2 of 10 series · 18 of 46 positions shown · IV contrast (omnipaque)
Comparison: Chest radiograph performed earlier today at [DATE] p.m.,
and CTA of the chest performed 09/01/2013

CLINICAL DATA: Acute onset of neck fullness and shortness of
breath. Personal history of DVT and pulmonary embolus. Initial
encounter.

EXAM:
CT ANGIOGRAPHY CHEST WITH CONTRAST
TECHNIQUE: Multidetector CT imaging of the chest was performed using the
standard protocol during bolus administration of intravenous
contrast. Multiplanar CT image reconstructions and MIPs were
obtained to evaluate the vascular anatomy.
CONTRAST:  80mL OMNIPAQUE IOHEXOL 350 MG/ML SOLN

[Series 6: thins · axial · 0.68mm/px · z∈[-860,-589]mm · 15 of 309 slices shown]
[im 19/309  lung]
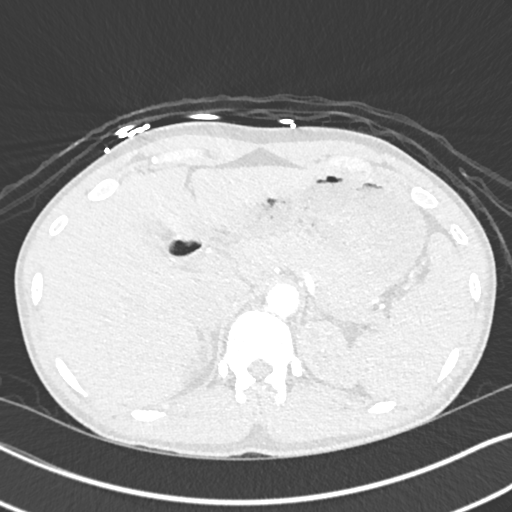
[im 37/309  soft-tissue]
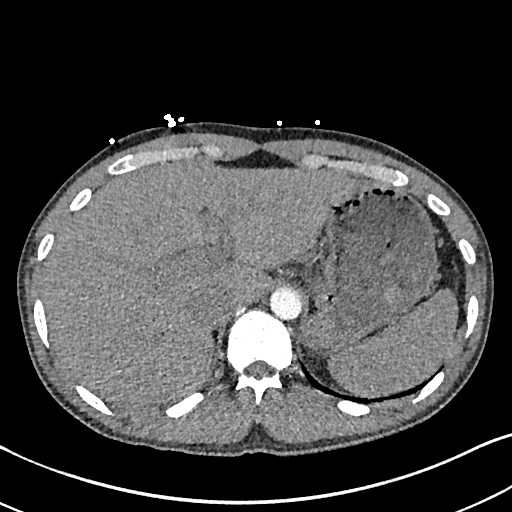
[im 55/309  lung]
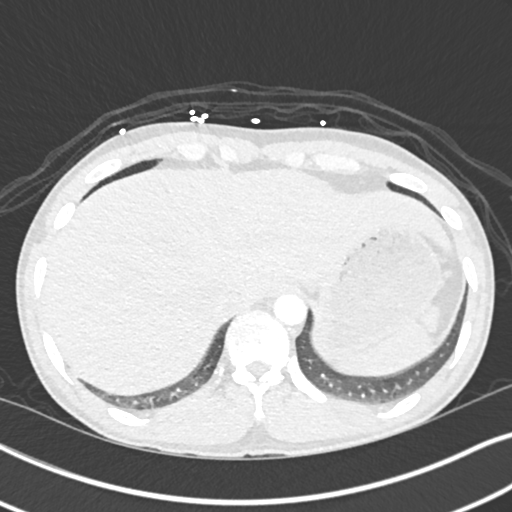
[im 73/309  soft-tissue]
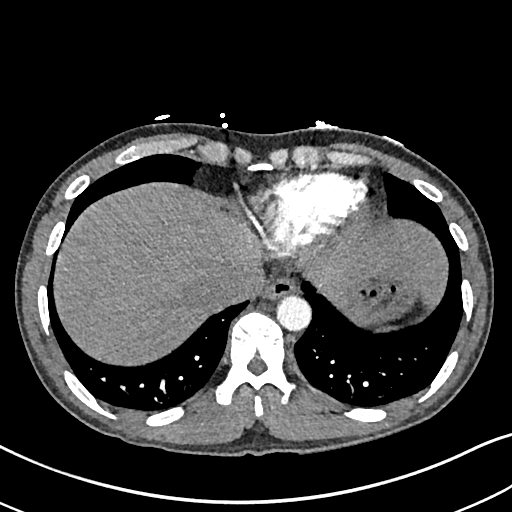
[im 91/309  lung]
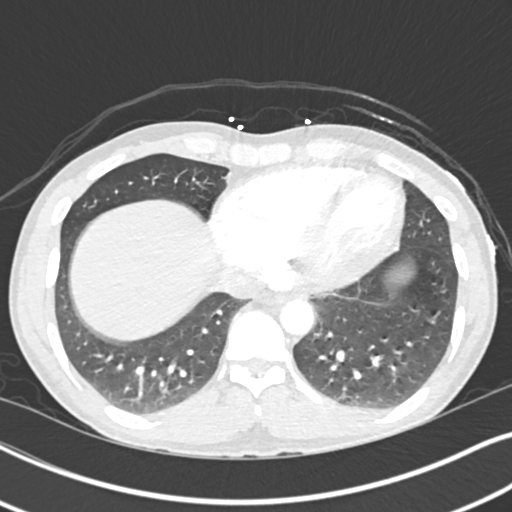
[im 109/309  soft-tissue]
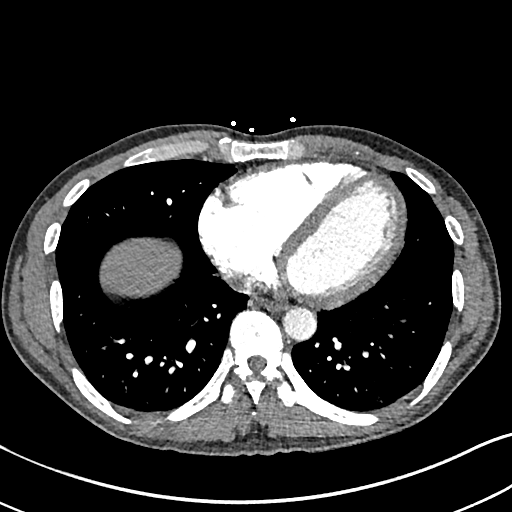
[im 127/309  lung]
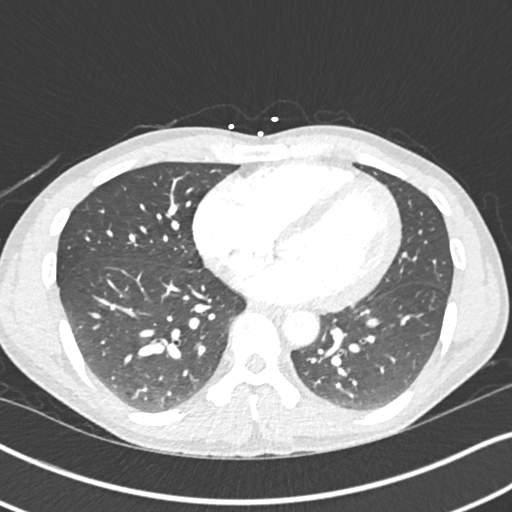
[im 164/309  soft-tissue]
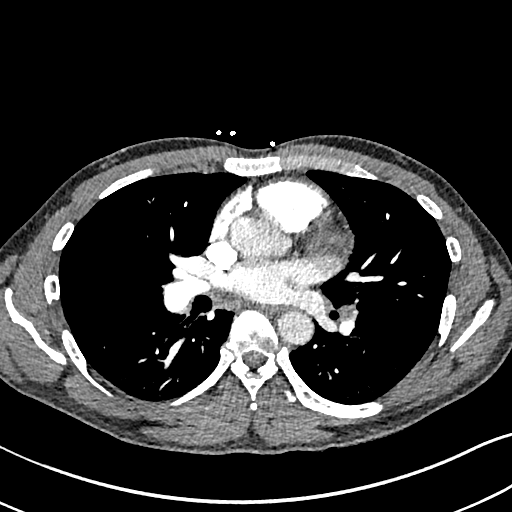
[im 182/309  lung]
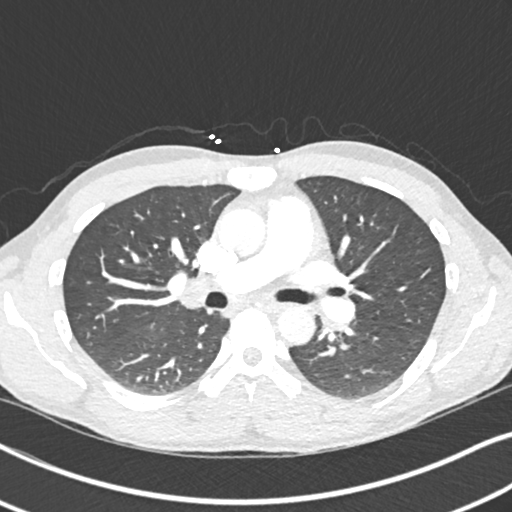
[im 200/309  soft-tissue]
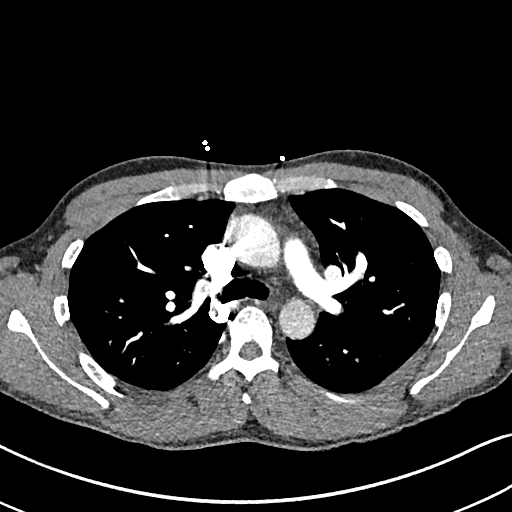
[im 218/309  lung]
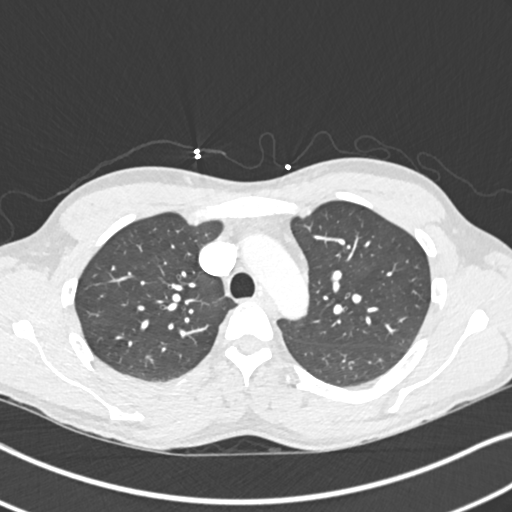
[im 236/309  soft-tissue]
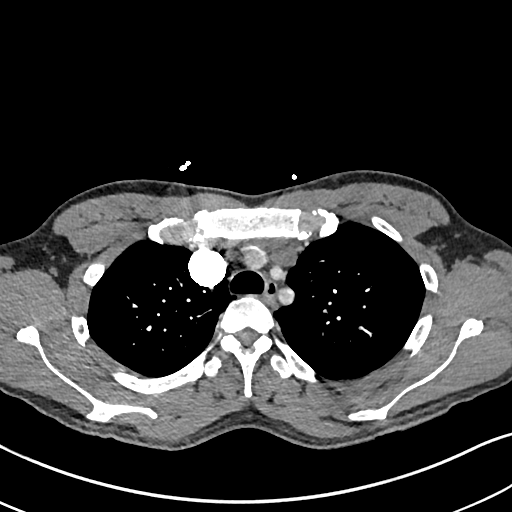
[im 254/309  lung]
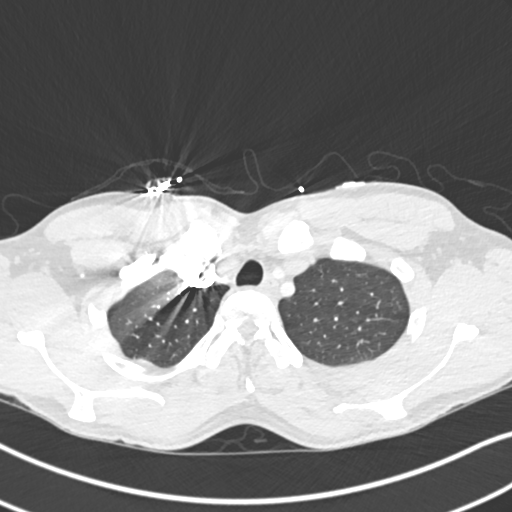
[im 272/309  soft-tissue]
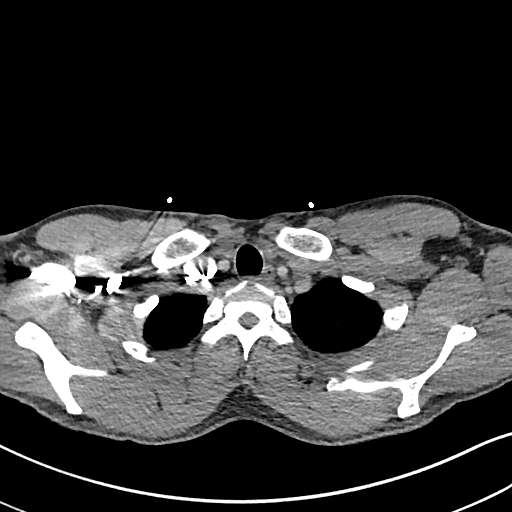
[im 290/309  lung]
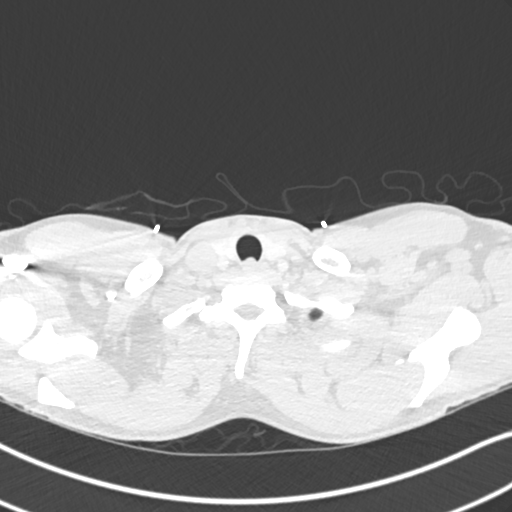

[Series 8: coronal mpr · coronal · 0.60mm/px · 3 of 111 slices shown]
[im 28/111  soft-tissue]
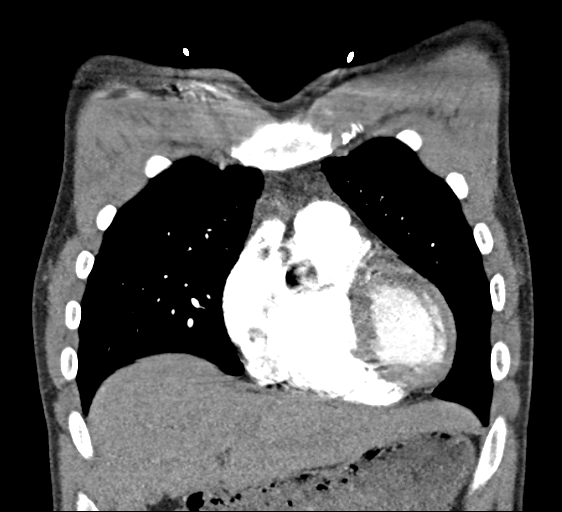
[im 56/111  soft-tissue]
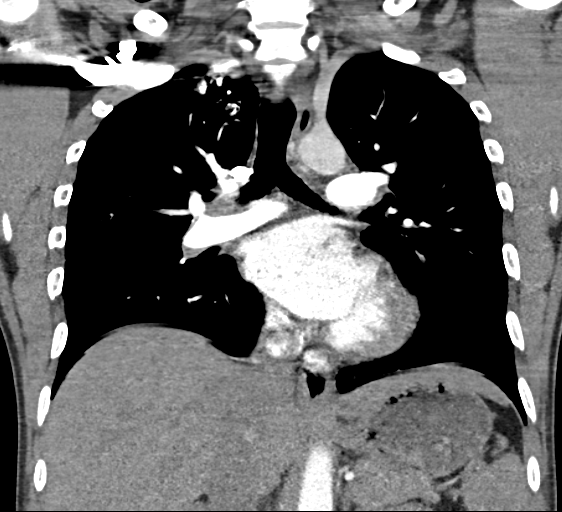
[im 83/111  soft-tissue]
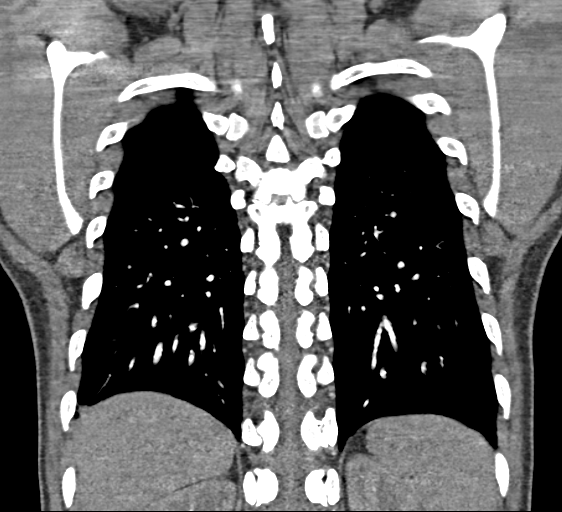

[18 of 46 positions shown; findings below may reference images not displayed]

FINDINGS: There is residual web within the pulmonary arteries to the lower
lobes bilaterally, with minimal filling defect to the anterior
aspect of the left lower lobe. Given the large clot burden on the
prior CTA, this is thought reflect residual chronic clot. No new
pulmonary embolus is identified.

Minimal left basilar atelectasis is noted. The lungs are otherwise
clear. There is no evidence of significant focal consolidation,
pleural effusion or pneumothorax. No masses are identified; no
abnormal focal contrast enhancement is seen.

A prominent 1.4 cm right hilar node is again noted, similar in
appearance to the prior study. Additional hilar nodes are normal in
size. No mediastinal lymphadenopathy is seen. No pericardial
effusion is identified. The vessels are grossly unremarkable in
appearance. No axillary lymphadenopathy is seen. The visualized
portions of the thyroid gland are unremarkable in appearance.

The visualized portions of the liver and spleen are unremarkable.
The visualized portions of the pancreas, gallbladder, stomach,
adrenal glands and kidneys are within normal limits.

No acute osseous abnormalities are seen.

Review of the MIP images confirms the above findings.
IMPRESSION: 1. Residual web within the pulmonary arteries to the lower lobes
bilaterally, with minimal filling defect to the anterior aspect of
the left lower lobe. Given the large clot burden on the prior CTA,
this is thought to reflect residual chronic clot. No new pulmonary
embolus seen.
2. Minimal left basilar atelectasis noted; lungs otherwise clear.
3. Stable prominence of a 1.4 cm right hilar node, nonspecific in
appearance.

## 2015-04-25 IMAGING — CR DG CHEST 2V
2 series · 2 of 2 positions shown · non-contrast
Comparison: Chest CT angiogram September 01, 2013

CLINICAL DATA: Shortness of breath.  History of pulmonary emboli

EXAM:
CHEST  2 VIEW

[w chest pa]
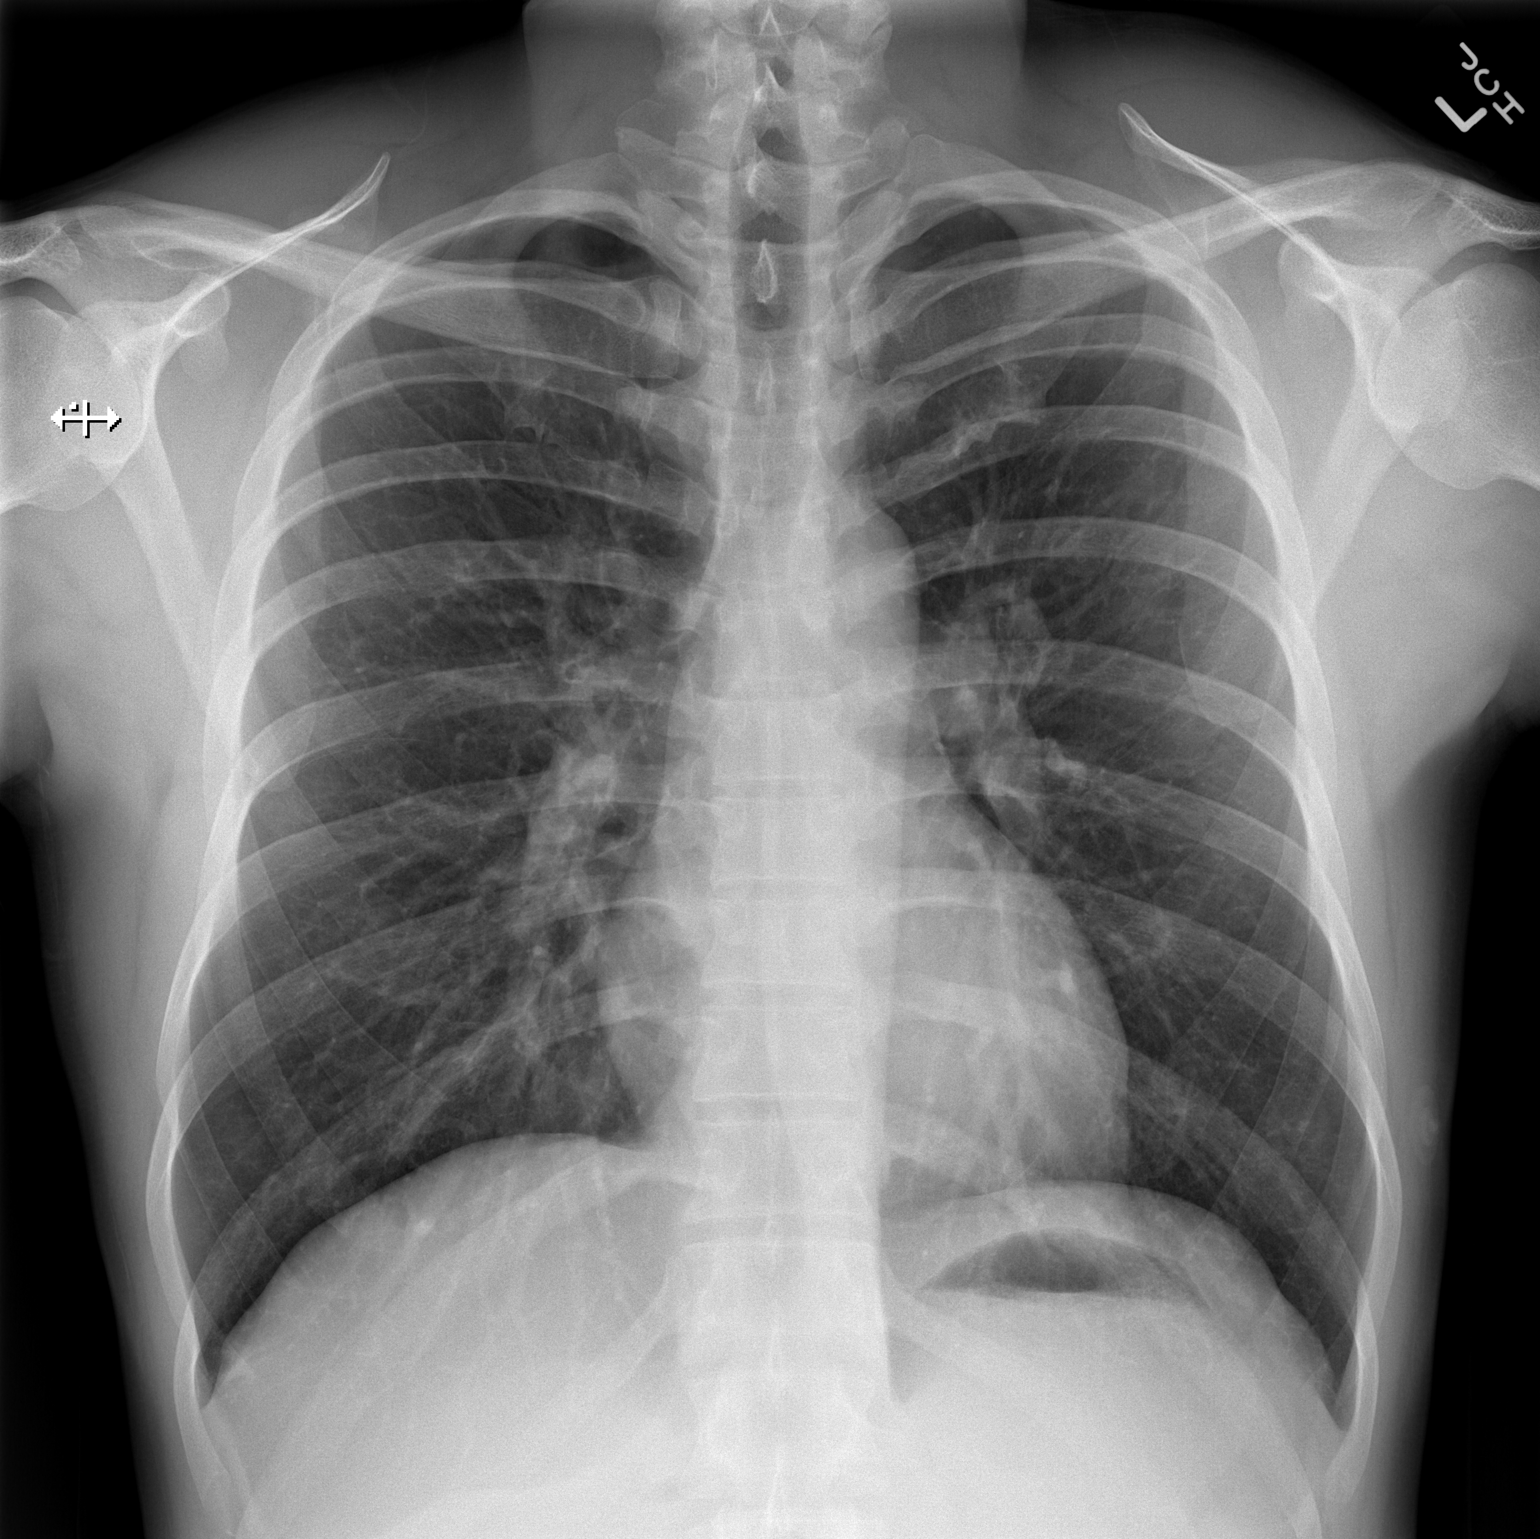

[w chest lat]
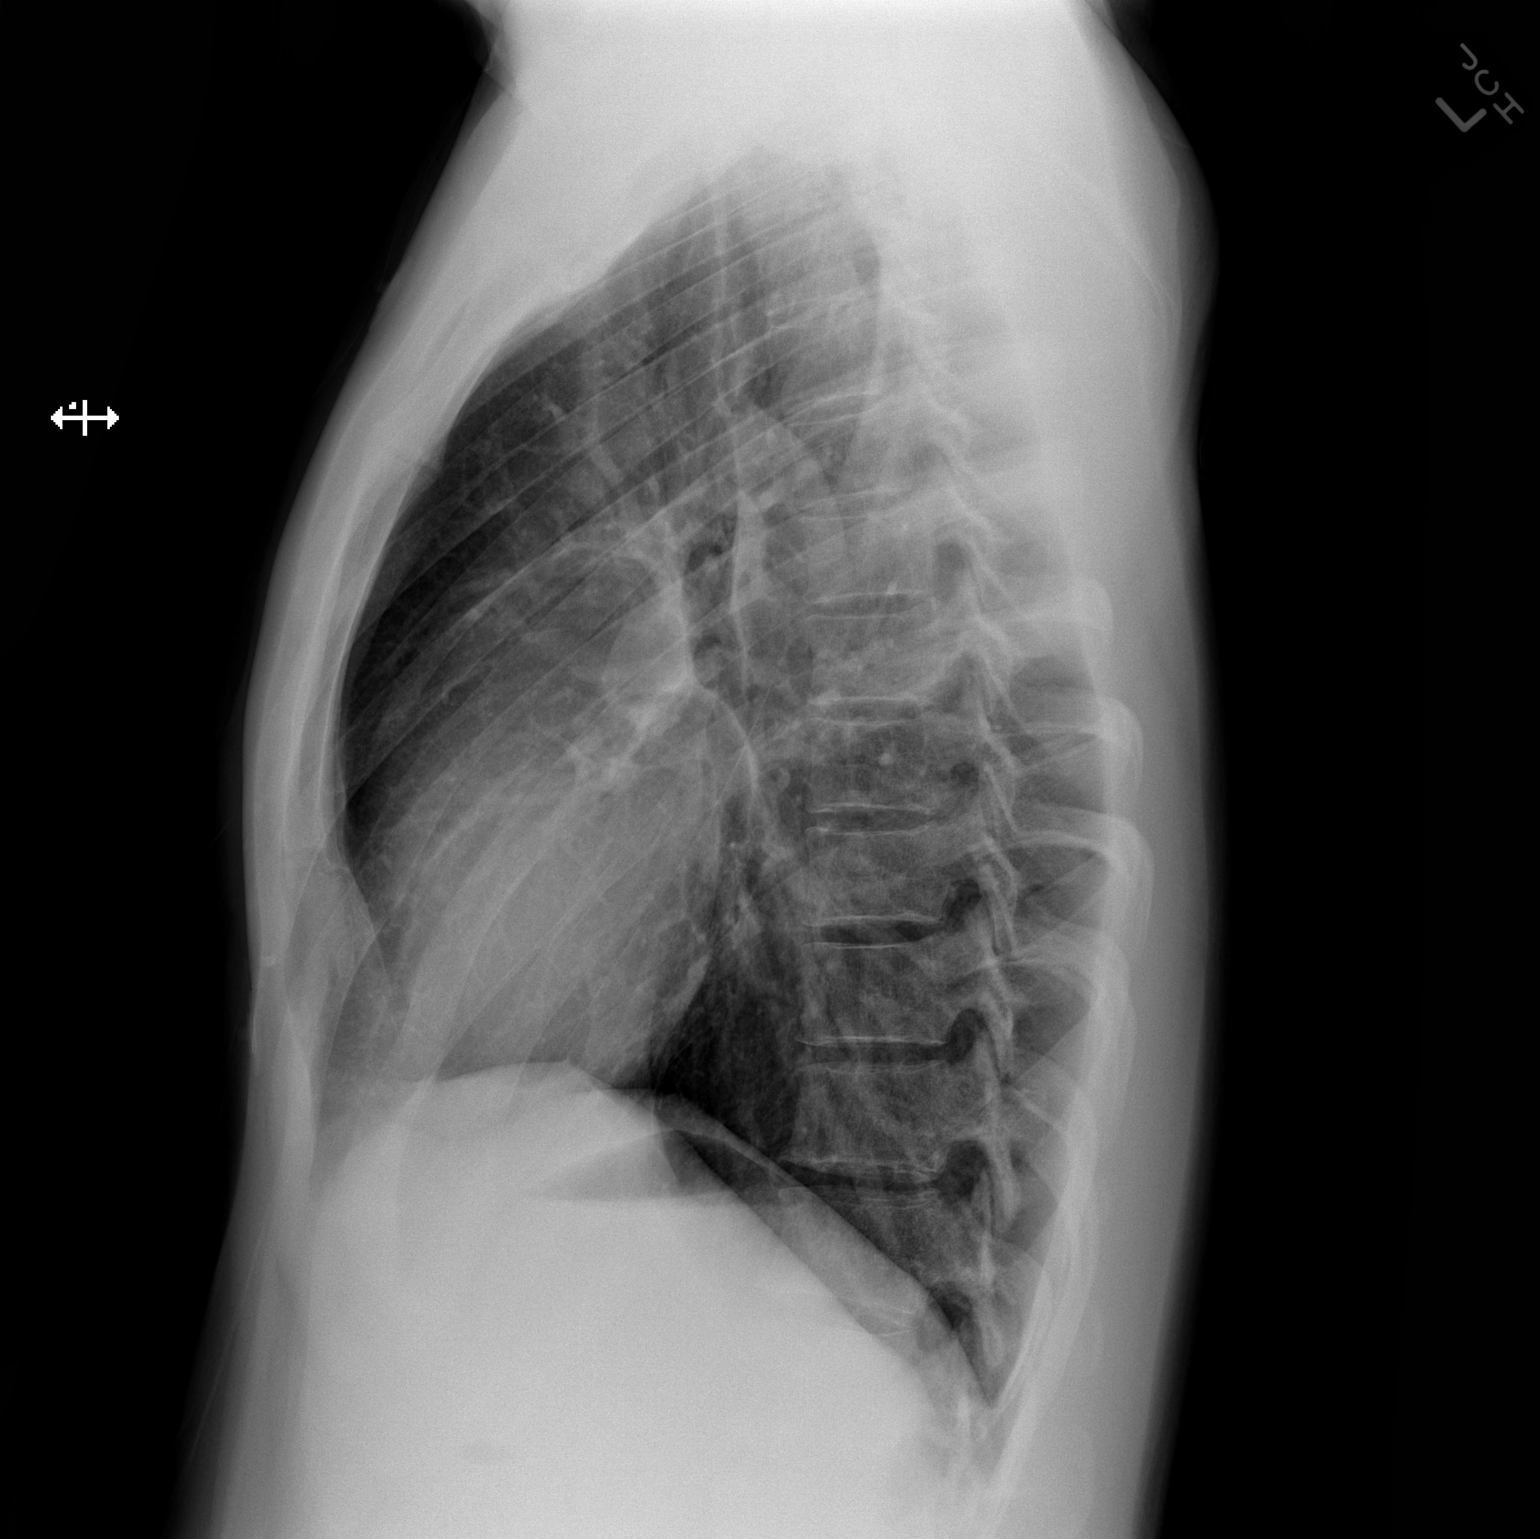

[2 of 2 positions shown; findings below may reference images not displayed]

FINDINGS: The lungs are clear. The heart size and pulmonary vascularity are
normal. No adenopathy. No bone lesions.
IMPRESSION: No edema or consolidation.

## 2015-12-13 ENCOUNTER — Encounter: Payer: Self-pay | Admitting: Cardiovascular Disease

## 2015-12-14 ENCOUNTER — Encounter: Payer: Self-pay | Admitting: Cardiovascular Disease

## 2015-12-14 ENCOUNTER — Ambulatory Visit (INDEPENDENT_AMBULATORY_CARE_PROVIDER_SITE_OTHER): Payer: 59 | Admitting: Cardiovascular Disease

## 2015-12-14 ENCOUNTER — Encounter (INDEPENDENT_AMBULATORY_CARE_PROVIDER_SITE_OTHER): Payer: Self-pay

## 2015-12-14 VITALS — BP 102/70 | HR 69 | Ht 72.0 in | Wt 188.8 lb

## 2015-12-14 DIAGNOSIS — I825Z2 Chronic embolism and thrombosis of unspecified deep veins of left distal lower extremity: Secondary | ICD-10-CM | POA: Diagnosis not present

## 2015-12-14 DIAGNOSIS — I8390 Asymptomatic varicose veins of unspecified lower extremity: Secondary | ICD-10-CM | POA: Diagnosis not present

## 2015-12-14 DIAGNOSIS — I493 Ventricular premature depolarization: Secondary | ICD-10-CM | POA: Diagnosis not present

## 2015-12-14 LAB — BASIC METABOLIC PANEL
BUN: 9 mg/dL (ref 7–25)
CO2: 26 mmol/L (ref 20–31)
Calcium: 9.6 mg/dL (ref 8.6–10.3)
Chloride: 103 mmol/L (ref 98–110)
Creat: 0.82 mg/dL (ref 0.60–1.35)
Glucose, Bld: 94 mg/dL (ref 65–99)
Potassium: 4.2 mmol/L (ref 3.5–5.3)
Sodium: 139 mmol/L (ref 135–146)

## 2015-12-14 LAB — TSH: TSH: 0.78 mIU/L (ref 0.40–4.50)

## 2015-12-14 NOTE — Progress Notes (Signed)
Cardiology Office Note   Date:  12/14/2015   ID:  Isaiah Payne, DOB [birth year], MRN [id number]  PCP:  Darrow BussingKOIRALA,DIBAS, MD  Cardiologist:   Kristeen MissPhilip Tegan Burnside, MD   Chief Complaint  Patient presents with  . Follow-up    hypercoagubility   1. 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 Massive bilateral pulmonary emboli  3. GERD   4. Multiple DVT   Isaiah ReapRoshan Payne is a 40 y.o. male who presents for further eval of some ayptical CP. Marland Kitchen. He has been told that he had GERD.   He has had progressive DOE for years   He was diagnosed with PE 6 months ago.    He originally presented with severe shortness of breath.   CT scan performed on August 12 revealed bilateral saddle  pulmonary emboli. He was instructed to go to the emergency room. Echocardiogram revealed PA pressures in the 55 range. He received TPA  And felt better within hours.    Has been on Xarelto.  He plays soccer 3 times a week and is breathing very well.    On Feb. 4, 2016, he has occasional episodes of pressure on both sides of his neck, associated with lots of phlegm and congestion. Work up at the ER was normal. Follow up CT scan showed no recurrent PE and follow up echo revealed normal LV and RV size and pressure.  He had a work up in [place] which suggested a  Protein S deficiency.  When he was diagnosed with the PE here, the protein S levels were reported normal  There was some discussion about him stopping the Xarelto after some period of time.   Nov. 28, 2017:  Isaiah Payne is seen back for a follow up visit - he now is having palpitations .  Is on Xarelto 20 for a hx of massive pulmonary emboli. He was thought to have a protein S deficiency but follow up testing did not show that abnormality  He is here for some palpitations . These typically occur when he is at rest . Just last for 5-20 seconds  No CP, no dypsnea.   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 DVT (deep venous thrombosis) (HCC) 2007   Left leg- txed with warfarin x 1 yr  . PE (pulmonary embolism)     No past surgical history on file.   Current Outpatient Prescriptions  Medication Sig Dispense Refill  . rivaroxaban (XARELTO) 20 MG TABS tablet Take 1 tablet (20 mg total) by mouth daily with supper. 30 tablet 0   No current facility-administered medications for this visit.     Allergies:   Patient has no known allergies.    Social History:  The patient  reports that he has never smoked. He has never used smokeless tobacco. He reports that he drinks alcohol. He reports that he does not use drugs.   Family History:  The patient's family history includes Transient ischemic attack in his father.    ROS:  Please see the history of present illness.    Review of Systems: Constitutional:  denies fever, chills, diaphoresis, appetite change and fatigue.  HEENT: denies photophobia, eye pain, redness, hearing loss, ear pain, congestion, sore throat, rhinorrhea, sneezing, neck pain, neck stiffness and tinnitus.  Respiratory: admits to SOB, DOE, cough,   Cardiovascular: denies chest pain, palpitations  and leg swelling.  Gastrointestinal: denies nausea, vomiting, abdominal pain, diarrhea, constipation, blood in stool.  Genitourinary: denies dysuria, urgency, frequency, hematuria, flank pain and difficulty urinating.  Musculoskeletal: denies  myalgias, back pain, joint swelling, arthralgias and gait problem.   Skin: denies pallor, rash and wound.  Neurological: denies dizziness, seizures, syncope, weakness, light-headedness, numbness and headaches.   Hematological: denies adenopathy, easy bruising, personal or family bleeding history.  Psychiatric/ Behavioral: denies suicidal ideation, mood changes, confusion, nervousness, sleep disturbance and agitation.       All other systems are reviewed and negative.     PHYSICAL EXAM: VS:  BP 102/70 (BP Location: Left Arm, Patient Position: Sitting, Cuff Size: Normal)   Pulse 69   Ht 6' (1.829 m)   Wt 188 lb 12.8 oz (85.6 kg)   BMI 25.61 kg/m  , BMI Body mass index is 25.61 kg/m. GEN: Well nourished, well developed, in no acute distress  HEENT: normal  Neck: no JVD, carotid bruits, or masses Cardiac: RRR; no murmurs, rubs, or gallops,no edema  Respiratory:  clear to auscultation bilaterally, normal work of breathing GI: soft, nontender, nondistended, + BS MS:  palapible cord in his left calf  -  Skin: warm and dry, no rash Neuro:  Strength and sensation are intact Psych: normal   EKG:  EKG is ordered today. The ekg ordered today demonstrates    NSR at 69.  Sinus arrhythmia .   Normal ECG    Recent Labs: No results found for requested labs within last 8760 hours.    Lipid Panel No results found for: CHOL, TRIG, HDL, CHOLHDL, VLDL, LDLCALC, LDLDIRECT    Wt Readings from Last 3 Encounters:  12/14/15 188 lb 12.8 oz (85.6 kg)  05/14/14 186 lb (84.4 kg)  03/12/14 186 lb 1.9 oz (84.4 kg)      Other studies Reviewed: Additional studies/ records that were reviewed today include: . Review of the above records demonstrates:    ASSESSMENT AND PLAN:  1.  Bilateral pulmonary emboli:  He has had 3 different DVTs and has had massive pulmonary emboli. He's had a hypercoagulability testing and was not found have any specific abnormalities. Testing in all suggested that he had a protein S deficiency but this has not been verified with repeat studies.  He just had another DVT several weeks ago. I think that we should continue with Xarelto. He may need to follow-up with hematology but at this point I think that we have no clear etiology for his repeated deep vein thrombosis.  He does have a history of a leg injury with a varicose vein in his right lower leg. He has considered  2. Palpitations: Clinically it sounds like he's having premature  ventricular contractions. I think that these are benign. We'll check a basic medical profile and a TSH today.  3. 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 He would like to see one of the surgeons to see if there is a possibility of vein stripping. I'm not sure if that's an option but we will send him to [facility] to get an opinion.    Disposition:   FU with me in 1 year.     Signed, Kristeen MissPhilip Mehreen Azizi, MD  12/14/2015 2:22 PM    [facility] [address], [place], [place]  [address] Phone: [phone]; Fax: [phone]

## 2015-12-14 NOTE — Patient Instructions (Addendum)
Medication Instructions:  Your physician recommends that you continue on your current medications as directed. Please refer to the Current Medication list given to you today.   Labwork: TODAY - basic metabolic panel, TSH   Testing/Procedures: None Ordered   Follow-Up: [facility] of varicose veins   Your physician wants you to follow-up in: 1 year with Dr. Elease HashimotoNahser.  You will receive a reminder letter in the mail two months in advance. If you don't receive a letter, please call our office to schedule the follow-up appointment.   If you need a refill on your cardiac medications before your next appointment, please call your pharmacy.   Thank you for choosing [facility]! Eligha BridegroomMichelle Geronimo Diliberto, RN [phone]

## 2015-12-16 ENCOUNTER — Telehealth: Payer: Self-pay | Admitting: Cardiovascular Disease

## 2015-12-16 NOTE — Telephone Encounter (Signed)
Mr.Buehler is returning a call . Thanks

## 2015-12-16 NOTE — Telephone Encounter (Signed)
See lab note.  

## 2015-12-28 ENCOUNTER — Other Ambulatory Visit: Payer: Self-pay | Admitting: *Deleted

## 2015-12-28 DIAGNOSIS — Z86718 Personal history of other venous thrombosis and embolism: Secondary | ICD-10-CM

## 2015-12-28 DIAGNOSIS — I83892 Varicose veins of left lower extremities with other complications: Secondary | ICD-10-CM

## 2016-01-13 ENCOUNTER — Encounter: Payer: Self-pay | Admitting: Vascular Surgery

## 2016-01-26 ENCOUNTER — Ambulatory Visit (INDEPENDENT_AMBULATORY_CARE_PROVIDER_SITE_OTHER): Payer: Self-pay | Admitting: Vascular Surgery

## 2016-01-26 ENCOUNTER — Encounter: Payer: Self-pay | Admitting: Vascular Surgery

## 2016-01-26 ENCOUNTER — Ambulatory Visit (HOSPITAL_COMMUNITY)
Admission: RE | Admit: 2016-01-26 | Discharge: 2016-01-26 | Disposition: A | Discharge status: Home / Self Care | Payer: Self-pay | Source: Ambulatory Visit | Attending: Vascular Surgery | Admitting: Vascular Surgery

## 2016-01-26 VITALS — BP 104/70 | HR 55 | Temp 98.0°F | Resp 16 | Ht 72.0 in | Wt 185.0 lb

## 2016-01-26 DIAGNOSIS — I83812 Varicose veins of left lower extremities with pain: Secondary | ICD-10-CM

## 2016-01-26 DIAGNOSIS — Z86718 Personal history of other venous thrombosis and embolism: Secondary | ICD-10-CM

## 2016-01-26 DIAGNOSIS — I83892 Varicose veins of left lower extremities with other complications: Secondary | ICD-10-CM

## 2016-01-26 NOTE — Progress Notes (Signed)
Patient name: Isaiah Payne MRN: [id number] DOB: [birth year] Sex: male  REASON FOR CONSULT: Varicose veins left lower extremity. Referred by Dr. Elease HashimotoNahser.  HPI: Isaiah Payne is a 41 y.o. male, he was referred by Dr. Elease HashimotoNahser with varicose veins of the left lower extremity.  I have reviewed the records from Dr. Harvie BridgeNahser's office. This patient has a protein S deficiency that was diagnosed in [place]. Apparently workup here did not show the same results. For this reason he was referred to oncology in April 2016. The plan was to continue at least 2 years of therapeutic anticoagulation with Gibson RampXeralto and then based on further hypercoagulable workup decide on potentially discontinuing this versus a low dose Xeralto at the end of the 2 year therapeutic course. The patient  is on Xeralto now. He has had previous massive bilateral pulmonary emboli. He's also had previous DVTs.  On my history, he attributes his DVT in 2007 to a long flight. He was on Coumadin for 1 year at that time. He had the bilateral pulmonary emboli in 2014 and has been on Xeralto since that time. 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 He is fairly active and plays some soccer and also has started running again. He experiences some aching pain in his legs with prolonged sitting and standing.  There is no family history of DVT that he is aware of. He has not followed up with the hematologist since his last visit.  Past Medical History:  Diagnosis Date  . DVT (deep venous thrombosis) (HCC) 2007   Left leg- txed with warfarin x 1 yr  . PE (pulmonary embolism)     Family History  Problem Relation Age of Onset  . Transient ischemic attack Father     SOCIAL HISTORY: Social History   Social History  . Marital status: Single    Spouse name: N/A  . Number of children: N/A  . Years of education: N/A   Occupational History  . Accounting    Social History Main Topics  . Smoking status: Never Smoker  . Smokeless  tobacco: Never Used     Comment: NEVER USED TOBACCO  . Alcohol use 0.0 oz/week     Comment: 1-2 beers daily  . Drug use: No  . Sexual activity: Not on file   Other Topics Concern  . Not on file   Social History Narrative  . No narrative on file    No Known Allergies  Current Outpatient Prescriptions  Medication Sig Dispense Refill  . rivaroxaban (XARELTO) 20 MG TABS tablet Take 1 tablet (20 mg total) by mouth daily with supper. 30 tablet 0   No current facility-administered medications for this visit.     REVIEW OF SYSTEMS:  [X]  denotes positive finding, [ ]  denotes negative finding Cardiac  Comments:  Chest pain or chest pressure:    Shortness of breath upon exertion:    Short of breath when lying flat:    Irregular heart rhythm:        Vascular    Pain in calf, thigh, or hip brought on by ambulation:    Pain in feet at night that wakes you up from your sleep:     Blood clot in your veins: X   Leg swelling:         Pulmonary    Oxygen at home:    Productive cough:     Wheezing:         Neurologic  Sudden weakness in arms or legs:     Sudden numbness in arms or legs:     Sudden onset of difficulty speaking or slurred speech:    Temporary loss of vision in one eye:     Problems with dizziness:         Gastrointestinal    Blood in stool:     Vomited blood:         Genitourinary    Burning when urinating:     Blood in urine:        Psychiatric    Major depression:         Hematologic    Bleeding problems:    Problems with blood clotting too easily:        Skin    Rashes or ulcers:        Constitutional    Fever or chills:      PHYSICAL EXAM: Vitals:   01/26/16 1442  BP: 104/70  Pulse: (!) 55  Resp: 16  Temp: 98 F (36.7 C)  TempSrc: Oral  SpO2: 100%  Weight: 185 lb (83.9 kg)  Height: 6' (1.829 m)    GENERAL: The patient is a well-nourished male, in no acute distress. The vital signs are documented above. CARDIAC: There is a regular  rate and rhythm.  VASCULAR: I do not detect carotid bruits. He has palpable femoral and pedal pulses bilaterally. He has mild bilateral lower extremity swelling. PULMONARY: There is good air exchange bilaterally without wheezing or rales. ABDOMEN: Soft and non-tender with normal pitched bowel sounds.  MUSCULOSKELETAL: There are no major deformities or cyanosis. NEUROLOGIC: No focal weakness or paresthesias are detected. SKIN: He has some prominent veins in his left distal medial leg and left proximal calf. PSYCHIATRIC: The patient has a normal affect.  DATA:   LEFT LOWER EXTREMITY VENOUS DUPLEX: I have independently interpreted his left lower extremity venous duplex scan. 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 There is no evidence of acute DVT in the left lower extremity. The patient does have deep vein reflux involving the left common femoral vein, femoral vein, and popliteal vein. There is also reflux in the left great saphenous vein.  MEDICAL ISSUES:  CHRONIC VENOUS INSUFFICIENCY: Based on his exam, he has evidence of chronic venous insufficiency of the left lower extremity involving both the deep system and the superficial system. We have discussed the importance of intermittent leg elevation the proper positioning for this. 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 I've also written him a prescription for knee-high compression stockings with a gradient of 15-20 mmHg. I've encouraged him to try to avoid prolonged sitting and standing and to continue to exercise as much as possible. I also encouraged him to do water aerobics which is also very helpful for people with chronic venous insufficiency. He does like to take his kids to the pole so this should be helpful for him. If his symptoms progress, then we could try him in thigh-high compression stockings with a gradient of 20-30 mmHg. If this were not  helpful that he could potentially be considered for laser ablation of the left great saphenous vein. I'll be happy to see him back in anytime if his symptoms progress.   Waverly Ferrari [facility] [phone]
